# Patient Record
Sex: Female | Born: 1945 | Race: White | Hispanic: No | Marital: Married
Health system: Midwestern US, Community
[De-identification: ages and names within clinical notes are randomized; demographics above are authoritative.]

## PROBLEM LIST (undated history)

## (undated) DIAGNOSIS — I77819 Aortic ectasia, unspecified site: Secondary | ICD-10-CM

## (undated) DIAGNOSIS — K824 Cholesterolosis of gallbladder: Secondary | ICD-10-CM

---

## 2016-02-25 MED ORDER — VALSARTAN 320 MG TAB
320 mg | ORAL_TABLET | ORAL | 3 refills | Status: DC
Start: 2016-02-25 — End: 2017-02-25

## 2016-03-17 ENCOUNTER — Ambulatory Visit: Admit: 2016-03-17 | Discharge: 2016-03-17 | Payer: MEDICARE | Attending: Surgery

## 2016-03-17 DIAGNOSIS — K824 Cholesterolosis of gallbladder: Secondary | ICD-10-CM

## 2016-03-17 NOTE — Patient Instructions (Signed)
She will go for a repeats sonography

## 2016-03-17 NOTE — Progress Notes (Signed)
History and Physical    Patient: Tiffany Harmon MRN: [id number]  SSN: [id number]    Date of Birth: 1946-05-30  Age: 70 y.o.  Sex: female      Subjective:      Tiffany Harmon is a 70 y.o. female who Is a patient of Dr.KS, is a diabetic  And complains of feeling some Pressure and discomfort In the right upper quadrant of her abdomen.  No history of jaundice, no previous surgeries and the sonogram Done in December 2016 shows a polyp in the gallblader versus cholesterolosis.  She has a relative who had gallbladder cancer and her friend has bile duct cancer who is doing well after surgery.  Her abdominal exam is benign today.  I will repeat her sonogram And she will most likely need a laparoscopic cholecystectomy    Past Medical History:   Diagnosis Date   ??? Asthma    ??? Diabetes (HCC)    ??? Hypertension    ??? Thyroid disease      History reviewed. No pertinent surgical history.   Family History   Problem Relation Age of Onset   ??? Cancer Father 4370     Breast  & prostate   ??? Cancer Son 3734     hodgkins / lymphoma     Social History   Substance Use Topics   ??? Smoking status: Never Smoker   ??? Smokeless tobacco: Not on file   ??? Alcohol use Yes      Comment: socially      Prior to Admission medications    Medication Sig Start Date End Date Taking? Authorizing Provider   furosemide (LASIX) 20 mg tablet Take  by mouth daily.   Yes Historical Provider   AMLODIPINE BESYLATE (AMLODIPINE PO) Take  by mouth.   Yes Historical Provider   magnesium oxide (MAG-OX) 400 mg tablet Take 400 mg by mouth daily.   Yes Historical Provider   levothyroxine (SYNTHROID) 112 mcg tablet Take  by mouth Daily (before breakfast).   Yes Historical Provider   metFORMIN (GLUCOPHAGE) 500 mg tablet Take  by mouth two (2) times daily (with meals).   Yes Historical Provider   levocetirizine (XYZAL) 5 mg tablet Take  by mouth.   Yes Historical Provider   montelukast (SINGULAIR) 10 mg tablet Take 10 mg by mouth daily.   Yes Historical Provider    cetirizine (ZYRTEC) 10 mg tablet Take  by mouth.   Yes Historical Provider   multivitamin, tx-iron-ca-min (THERA-M W/ IRON) 9 mg iron-400 mcg tab tablet Take 1 Tab by mouth daily.   Yes Historical Provider   calcium citrate-vitamin D3 (CITRACAL + D) tablet Take  by mouth two (2) times a day.   Yes Historical Provider   vit B Cmplx 3-FA-Vit C-Biotin (NEPHRO VITE RX) 1-60-300 mg-mg-mcg tablet Take 1 Tab by mouth daily.   Yes Historical Provider   cholecalciferol, vitamin D3, (VITAMIN D3) 2,000 unit tab Take  by mouth.   Yes Historical Provider   LACTOBACILLUS ACIDOPHILUS (PROBIOTIC PO) Take  by mouth.   Yes Historical Provider   Omega-3-DHA-EPA-Fish Oil 1,000 mg (120 mg-180 mg) cap Take  by mouth.   Yes Historical Provider   amylase-lipase-protease (CREON 12000) 12,000-38,000 -60,000 unit capsule Take  by mouth three (3) times daily (with meals).   Yes Historical Provider   valsartan (DIOVAN) 320 mg tablet TAKE 1 TABLET BY MOUTH EVERY DAY 02/25/16  Yes Thurnell GarbeShital Parikh, MD        Allergies  Allergen Reactions   ??? Aspirin Unknown (comments)     Headache    ??? Codeine Nausea and Vomiting       Review of Systems:  Negative for arthritis, phlebitis, myocardial, infarction, angina, shortness of breath, stroke, asthma, headaches, TB, emphysema, chronic cough, nausea, vomiting, diarrhea,, constipation or alteration of bowel habits, rectal bleeding pain, blood in stool or black stools, weight loss, anorexia, jaundice, heartburn, nocturia, urinary frequency, kidney stones.    Objective:     Vitals:    03/17/16 1143   BP: 134/87   Pulse: 70        Physical Exam:  Vital signs as above.  Well developed, well nourished individual, alert and oriented.  HEAD &  NECK:  no jaundice, eye movements normal, pharynx clear, neck supple without masses or nodes.  Lungs: Clear to auscultation.  Heart: Both heart sounds heard with regular rate. Breast: No masses, no tenderness, no palpable nodes, no discharge.  Skin, neurovascular,  extremities: normal, no edema, no adenopathy.  Abdomen: Soft, no tenderness, liver, kidneys and spleen not palpable, no hernias.  Rectal exam deferred.    Labs:  No results found for this or any previous visit (from the past 24 hour(s)).    Imaging:    No results found.    Assessment:     Hospital Problems  Date Reviewed: 03/17/2016    None          Plan:   To go for repeat sonog,Procedure discussed with the patient in detail, benefits, possible risks and complications explained to the patient.  The patient understands and agrees.      Signed By: Luetta Nutting, MD     March 17, 2016

## 2016-03-18 MED ORDER — FUROSEMIDE 20 MG TAB
20 mg | ORAL_TABLET | Freq: Every day | ORAL | 1 refills | Status: DC
Start: 2016-03-18 — End: 2016-09-11

## 2016-03-18 NOTE — Telephone Encounter (Signed)
Requested Prescriptions     Pending Prescriptions Disp Refills   ??? furosemide (LASIX) 20 mg tablet 90 Tab 1     Sig: Take 1 Tab by mouth daily.

## 2016-03-20 ENCOUNTER — Encounter: Attending: Interventional Cardiology

## 2016-04-03 MED ORDER — AMLODIPINE 10 MG TAB
10 mg | ORAL_TABLET | Freq: Every day | ORAL | 1 refills | Status: DC
Start: 2016-04-03 — End: 2016-09-25

## 2016-04-03 NOTE — Telephone Encounter (Signed)
Requested Prescriptions     Pending Prescriptions Disp Refills   ??? amLODIPine (NORVASC) 10 mg tablet 90 Tab 1     Sig: Take 1 Tab by mouth daily.

## 2016-05-12 ENCOUNTER — Encounter

## 2016-05-13 ENCOUNTER — Encounter

## 2016-05-20 ENCOUNTER — Inpatient Hospital Stay: Admit: 2016-05-20 | Payer: MEDICARE | Attending: Surgery

## 2016-05-20 DIAGNOSIS — K824 Cholesterolosis of gallbladder: Secondary | ICD-10-CM

## 2016-05-20 LAB — CREATININE AND GFR
Creatinine: 0.66 mg/dL (ref 0.40–1.16)
GFR est AA: >60 mL/min/{1.73_m2} (ref >60)
GFR est non-AA: >60 mL/min/{1.73_m2} (ref >60)

## 2016-05-20 LAB — BUN: BUN: 17 mg/dL (ref 7–18)

## 2016-05-20 MED ORDER — SODIUM CHLORIDE 0.9 % INJECTION
INTRAMUSCULAR | Status: AC
Start: 2016-05-20 — End: 2016-05-20
  Administered 2016-05-20: 14:00:00

## 2016-05-20 MED ORDER — GADOBUTROL 10 MMOL/10 ML (1 MMOL/ML) IV
10 mmol/ mL (1 mmol/mL) | INTRAVENOUS | Status: AC
Start: 2016-05-20 — End: 2016-05-20
  Administered 2016-05-20: 14:00:00

## 2016-05-20 MED FILL — SODIUM CHLORIDE 0.9 % INJECTION: INTRAMUSCULAR | Qty: 50

## 2016-05-20 MED FILL — GADAVIST 10 MMOL/10 ML (1 MMOL/ML) INTRAVENOUS SOLUTION: 10 mmol/ mL (1 mmol/mL) | INTRAVENOUS | Qty: 10

## 2016-06-03 ENCOUNTER — Ambulatory Visit: Attending: Interventional Cardiology

## 2016-06-03 ENCOUNTER — Ambulatory Visit: Admit: 2016-06-03 | Discharge: 2016-06-03 | Payer: MEDICARE | Attending: Interventional Cardiology

## 2016-06-03 DIAGNOSIS — I10 Essential (primary) hypertension: Secondary | ICD-10-CM

## 2016-06-03 NOTE — Progress Notes (Signed)
Perminder Lynnell Dike, MD [facility]       Mitzi Hansen, MD [facility]     Doris Cheadle, MD [facility]      Norberta Keens Wyline Mood, MD [facility]     Hilary Hertz, MD [facility]       Santiago Bumpers, MD [facility] Office: [phone]       [place] [phone]  ______________________________________________________________________    Impressions/Visit Diagnoses:  Patient Active Problem List    Diagnosis Date Noted   ??? Essential hypertension 06/03/2016   ??? Type 2 diabetes mellitus (HCC) 06/03/2016   ??? Aortic dilatation (HCC) - very mild (3.4cm) on CTA (05/2015) 06/03/2016   ??? Morbid obesity due to excess calories (HCC) 06/03/2016               Recommendations/Plan:  1. EKG was reviewed with the patient and shows no change from prior EKGs  2. Pressure is adequately controlled??? continue current medications  3. Diet and exercise were counseled with the patient  4. No change in medications  5. Follow-up in 1 year      I thank you for giving me opportunity to participate in care of Tiffany Harmon. I will update you with further plan of Her care at the time of follow up, mean while, should there be any questions or comments about Her management, please do not hesitate to contact me.    Sincerely,  Thurnell Garbe, MD      ______________________________________________________________________      No change in exercise capacity, No CP/SOB/Dizziness or Palpitations. No C/O LE edema.      Past Medical History:   Diagnosis Date   ??? Asthma    ??? Diabetes (HCC)    ??? Hypertension    ??? Thyroid disease        History reviewed. No pertinent surgical history.        Allergies   Allergen Reactions   ??? Aspirin Unknown (comments)     Headache    ??? Codeine Nausea and Vomiting       Family History   Problem Relation Age of Onset   ??? Cancer Father 44     Breast  & prostate   ??? Cancer Son 78     hodgkins / lymphoma       Social History     Social History   ??? Marital status: MARRIED     Spouse name: N/A   ??? Number of children: N/A    ??? Years of education: N/A     Occupational History   ??? Not on file.     Social History Main Topics   ??? Smoking status: Never Smoker   ??? Smokeless tobacco: Never Used   ??? Alcohol use Yes      Comment: socially   ??? Drug use: No   ??? Sexual activity: Not on file     Other Topics Concern   ??? Not on file     Social History Narrative         Review of Systems   Constitutional: Negative for chills, diaphoresis, fever, malaise/fatigue and weight loss.   Respiratory: Negative for cough and shortness of breath.    Cardiovascular: Negative for chest pain, palpitations, orthopnea and leg swelling.   Gastrointestinal: Negative for abdominal pain, diarrhea, heartburn, nausea and vomiting.   Neurological: Negative for dizziness, loss of consciousness and weakness.  Physical Exam:      Visit Vitals   ??? BP 114/84 (BP 1 Location: Right arm, BP Patient Position: Sitting)   ??? Pulse 70   ??? Resp 18   ??? Ht 5\' 3"  (1.6 m)   ??? Wt 208 lb (94.3 kg)   ??? BMI 36.85 kg/m2       Physical Exam:    General: Alert, comfortable   HEENT: Normocephalic, atraumatic   Neck: No JVD, No Bruits   Chest wall: NL excursion   Lungs: Clear B/L   CV: NL S1, S2, no murmurs, gallops   Abdomen: Soft, NL BS, No distention   Extremity:No clubbing, cyanosis, edema   Neuro: Alert, oriented   Skin: No open sores, bruises   Pulses:  2+ dorsalis pedis    Tests - Procedures Reviewed      ECG (06/03/16)   Normal sinus rhythm, within normal limits  none    Labs - reviewed     Hospital Outpatient Visit on 05/20/2016   Component Date Value Ref Range Status   ??? Creatinine 05/20/2016 0.66  0.40 - 1.16 mg/dL Final   ??? GFR est AA 05/20/2016 >60  >60 ml/min/1.46m2 Final   ??? GFR est non-AA 05/20/2016 >60  >60 ml/min/1.25m2 Final    Comment: (NOTE)  Estimated GFR is calculated using the Modification of Diet in Renal   Disease (MDRD) Study equation, reported for both African Americans   (GFRAA) and non-African Americans (GFRNA), and normalized to 1.38m2    body surface area. The physician must decide which value applies to   the patient. The MDRD study equation should only be used in   individuals age 40 or older. It has not been validated for the   following: pregnant women, patients with serious comorbid conditions,   or on certain medications, or persons with extremes of body size,   muscle mass, or nutritional status.     ??? BUN 05/20/2016 17  7 - 18 mg/dL Final       No results found for: CHOL, HDL, 100065, [id number], 1000, LDLC, VLDL    Care Plan Discussion   Patient   Family   Other Physician :    Counseling Performed   Dietary Counseling and Exercise    Records Requested / Reviewed   Hospital records / Records from other physicians reviewed - summary as above. Copies in chart

## 2016-09-11 MED ORDER — FUROSEMIDE 20 MG TAB
20 mg | ORAL_TABLET | ORAL | 1 refills | Status: DC
Start: 2016-09-11 — End: 2017-02-23

## 2016-09-11 NOTE — Telephone Encounter (Signed)
Requested Prescriptions     Pending Prescriptions Disp Refills   ??? furosemide (LASIX) 20 mg tablet [Pharmacy Med Name: FUROSEMIDE 20 MG TABLET] 90 Tab 1     Sig: TAKE 1 TABLET BY MOUTH EVERY DAY

## 2016-09-25 MED ORDER — AMLODIPINE 10 MG TAB
10 mg | ORAL_TABLET | ORAL | 1 refills | Status: DC
Start: 2016-09-25 — End: 2017-04-14

## 2017-02-23 MED ORDER — FUROSEMIDE 20 MG TAB
20 mg | ORAL_TABLET | ORAL | 1 refills | Status: DC
Start: 2017-02-23 — End: 2017-08-06

## 2017-02-24 NOTE — Telephone Encounter (Signed)
Requested Prescriptions     Pending Prescriptions Disp Refills   ??? valsartan (DIOVAN) 320 mg tablet 90 Tab 1     Sig: TAKE 1 TABLET BY MOUTH EVERY DAY     par

## 2017-02-25 MED ORDER — VALSARTAN 320 MG TAB
320 mg | ORAL_TABLET | ORAL | 1 refills | Status: DC
Start: 2017-02-25 — End: 2017-08-25

## 2017-04-14 MED ORDER — AMLODIPINE 10 MG TAB
10 mg | ORAL_TABLET | Freq: Every day | ORAL | 1 refills | Status: DC
Start: 2017-04-14 — End: 2017-06-11

## 2017-04-14 NOTE — Telephone Encounter (Signed)
Requested Prescriptions     Pending Prescriptions Disp Refills   ??? amLODIPine (NORVASC) 10 mg tablet 90 Tab 1     Sig: Take 1 Tab by mouth daily.

## 2017-05-18 NOTE — Telephone Encounter (Signed)
-----   Message from Joesph July sent at 05/17/2017  4:13 PM EDT -----  Regarding: RE: Ankle swelling  Pt is in [place] until Labor day, she has an appt set up for when she returns.    ----- Message -----     From: Fara Boros, MD     Sent: 05/17/2017   4:01 PM       To: Conard Novak, LPN, #  Subject: RE: Ankle swelling                               Patient has not been seen in our office since last September.  She needs to be seen by whoever can see her ASAP  ----- Message -----     From: Conard Novak, LPN     Sent: 03/04/3709  11:56 AM       To: Fara Boros, MD  Subject: FW: Ankle swelling                               Spoke with pt this morning.  States she has had swelling in her ankle all summer.  Broke her Tibia at the beginning of the summer and has not been as active as usual.  She does take Amlodipine 10mg  but has been on this medication for a long time.  She has recently started Evista and is not sure if that could cause it.  States the swelling is mostly in the ankle on the same side of the fracture but is happening on the other ankle as well.  She also on valsartan and is inquiring about switching.  She is a patient of Dr. Ramiro Harvest.  Please advise.     ----- Message -----     From: Joesph July     Sent: 05/17/2017  11:41 AM       To: [facility]  Subject: Ankle swelling                                   [facility] - Telephone Message  RE: Tiffany Harmon 71 y.o. August 04, 1946     [phone]         Telephone Information:  Mobile          [phone]      patient called.    Reason for Call: Pt states she has had ankle swelling. She also mentioned that she broke her Tibia and hasn't been very active. Call the mobile #.

## 2017-06-08 ENCOUNTER — Encounter: Attending: Interventional Cardiology

## 2017-06-11 ENCOUNTER — Ambulatory Visit: Admit: 2017-06-11 | Discharge: 2017-06-11 | Payer: MEDICARE | Attending: Interventional Cardiology

## 2017-06-11 DIAGNOSIS — I77819 Aortic ectasia, unspecified site: Secondary | ICD-10-CM

## 2017-06-11 MED ORDER — AMLODIPINE 5 MG TAB
5 mg | ORAL_TABLET | Freq: Every day | ORAL | 3 refills | Status: DC
Start: 2017-06-11 — End: 2017-07-02

## 2017-06-11 NOTE — Progress Notes (Signed)
Tiffany Lynnell DikeS Grewal, MD [facility]       Tiffany HansenArvind K Agarwal, MD [facility]     Tiffany CheadleBangalore S Sridhara, MD [facility]      Tiffany KeensSunandan Wyline MoodA Pandya, MD [facility]     Tiffany HertzShital R Caelynn Marshman, MD [facility]       Tiffany Bumpersajiv Singh, MD [facility]       [place] Office: [phone]       [place] Office:([phone]  ______________________________________________________________________    Impressions/Visit Diagnoses:  Patient Active Problem List    Diagnosis Date Noted   ??? Swelling of lower extremity 06/11/2017   ??? Essential hypertension 06/03/2016   ??? Type 2 diabetes mellitus (HCC) 06/03/2016   ??? Aortic dilatation (HCC) - very mild (3.4cm) on CTA (05/2015) 06/03/2016   ??? Morbid obesity due to excess calories (HCC) 06/03/2016               Recommendations/Plan:  1. EKG was reviewed with the patient and shows no change from prior EKGs  2. Blood pressure is adequately controlled??? continue current medications  3. Diet and exercise were counseled with the patient  4. Decrease amlodipine to 5 mg daily  5. Echo to assess EF  6. Follow-up in 1 month      I thank you for giving me opportunity to participate in care of Tiffany Harmon. I will update you with further plan of Her care at the time of follow up, mean while, should there be any questions or comments about Her management, please do not hesitate to contact me.    Sincerely,  Tiffany GarbeShital Joyceline Maiorino, MD      ______________________________________________________________________      No change in exercise capacity, No CP/SOB/Dizziness or Palpitations. LE edema daily for the last 2 months.    Past Medical History:   Diagnosis Date   ??? Asthma    ??? Diabetes (HCC)    ??? Hypertension    ??? Thyroid disease    ??? Tibia fracture 02/2017       No past surgical history on file.        Allergies   Allergen Reactions   ??? Aspirin Unknown (comments)     Headache    ??? Codeine Nausea and Vomiting       Family History   Problem Relation Age of Onset   ??? Cancer Father 5470     Breast  & prostate   ??? Cancer Son 4734     hodgkins / lymphoma        Social History     Social History   ??? Marital status: MARRIED     Spouse name: N/A   ??? Number of children: N/A   ??? Years of education: N/A     Occupational History   ??? Not on file.     Social History Main Topics   ??? Smoking status: Never Smoker   ??? Smokeless tobacco: Never Used   ??? Alcohol use Yes      Comment: socially   ??? Drug use: No   ??? Sexual activity: Not on file     Other Topics Concern   ??? Not on file     Social History Narrative         Review of Systems   Constitutional: Negative for chills, diaphoresis, fever, malaise/fatigue and weight loss.   Respiratory: Negative for cough and shortness of breath.    Cardiovascular: Negative for chest pain, palpitations, orthopnea and leg swelling.   Gastrointestinal: Negative for abdominal pain,  diarrhea, heartburn, nausea and vomiting.   Neurological: Negative for dizziness, loss of consciousness and weakness.           Physical Exam:      Visit Vitals   ??? BP 130/80 (BP 1 Location: Right arm, BP Patient Position: Sitting)   ??? Pulse 67   ??? Resp 16   ??? Ht  (1.6 m)   ??? Wt 220 lb (99.8 kg)   ??? BMI 38.97 kg/m2       Physical Exam:    General: Alert, comfortable   HEENT: Normocephalic, atraumatic   Neck: No JVD, No Bruits   Chest wall: NL excursion   Lungs: Clear B/L   CV: NL S1, S2, no murmurs, gallops   Abdomen: Soft, NL BS, No distention   Extremity:No clubbing, cyanosis, edema   Neuro: Alert, oriented   Skin: No open sores, bruises   Pulses:  2+ dorsalis pedis    Tests - Procedures Reviewed      ECG (06/11/17)   Normal sinus rhythm, normal axis, incomplete right bundle branch block  none    Labs - reviewed         No results found for: CHOL, HDL, 100065, [id number], 1000, LDLC, VLDL    Care Plan Discussion   Patient   Family   Other Physician :    Counseling Performed   Dietary Counseling and Exercise    Records Requested / Reviewed   Hospital records / Records from other physicians reviewed - summary as above. Copies in chart

## 2017-06-22 ENCOUNTER — Encounter: Admit: 2017-06-22 | Discharge: 2017-06-22 | Payer: MEDICARE

## 2017-06-28 ENCOUNTER — Encounter

## 2017-07-02 ENCOUNTER — Ambulatory Visit: Admit: 2017-07-02 | Discharge: 2017-07-02 | Payer: MEDICARE | Attending: Interventional Cardiology

## 2017-07-02 DIAGNOSIS — I77819 Aortic ectasia, unspecified site: Secondary | ICD-10-CM

## 2017-07-02 MED ORDER — HYDRALAZINE 25 MG TAB
25 mg | ORAL_TABLET | Freq: Two times a day (BID) | ORAL | 2 refills | Status: DC
Start: 2017-07-02 — End: 2017-08-06

## 2017-07-02 NOTE — Progress Notes (Signed)
Perminder Lynnell Dike, MD [facility]       Mitzi Hansen, MD [facility]     Doris Cheadle, MD [facility]      Norberta Keens Wyline Mood, MD [facility]     Hilary Hertz, MD [facility]       Santiago Bumpers, MD [facility] Office: [phone]       [place] [phone]  ______________________________________________________________________    Impressions/Visit Diagnoses:  Patient Active Problem List    Diagnosis Date Noted   ??? Swelling of lower extremity 06/11/2017   ??? Essential hypertension 06/03/2016   ??? Type 2 diabetes mellitus (HCC) 06/03/2016   ??? Aortic dilatation (HCC) - very mild (3.4cm) on CTA (05/2015) 06/03/2016   ??? Morbid obesity due to excess calories (HCC) 06/03/2016             Recommendations/Plan:  1. Echocardiogram results were reviewed with the patient and show a prominent ascending aorta at 3.6 cm which is unchanged from prior measurements.  LV function and valve status is normal  2. Blood pressure is elevated and pt still c/o LE edema though improved  3. D/C amlodipine and start hydralazine at 25 mg q12  4. Diet and exercise were counseled with the patient  5. F/u in one month      I thank you for giving me opportunity to participate in care of Tiffany Harmon. I will update you with further plan of Her care at the time of follow up, mean while, should there be any questions or comments about Her management, please do not hesitate to contact me.    Sincerely,  Thurnell Garbe, MD      ______________________________________________________________________      No change in exercise capacity, No CP/SOB/Dizziness or Palpitations. LE edema has improved on lower dose of amlodipine - though still present.  BP elevated to 140's    Past Medical History:   Diagnosis Date   ??? Asthma    ??? Diabetes (HCC)    ??? Hypertension    ??? Thyroid disease    ??? Tibia fracture 02/2017       No past surgical history on file.        Allergies   Allergen Reactions   ??? Aspirin Unknown (comments)     Headache     ??? Codeine Nausea and Vomiting       Family History   Problem Relation Age of Onset   ??? Cancer Father 93     Breast  & prostate   ??? Cancer Son 89     hodgkins / lymphoma       Social History     Social History   ??? Marital status: MARRIED     Spouse name: N/A   ??? Number of children: N/A   ??? Years of education: N/A     Occupational History   ??? Not on file.     Social History Main Topics   ??? Smoking status: Never Smoker   ??? Smokeless tobacco: Never Used   ??? Alcohol use Yes      Comment: socially   ??? Drug use: No   ??? Sexual activity: Not on file     Other Topics Concern   ??? Not on file     Social History Narrative         Review of Systems   Constitutional: Negative for chills, diaphoresis, fever, malaise/fatigue and weight loss.   Respiratory: Negative  for cough and shortness of breath.    Cardiovascular: Negative for chest pain, palpitations, orthopnea and leg swelling.   Gastrointestinal: Negative for abdominal pain, diarrhea, heartburn, nausea and vomiting.   Neurological: Negative for dizziness, loss of consciousness and weakness.           Physical Exam:      Visit Vitals   ??? BP 154/78 (BP 1 Location: Right arm, BP Patient Position: Sitting)   ??? Pulse 72   ??? Resp 16   ??? Ht 5\' 3"  (1.6 m)   ??? Wt 224 lb (101.6 kg)   ??? BMI 39.68 kg/m2       Physical Exam:    General: Alert, comfortable   HEENT: Normocephalic, atraumatic   Neck: No JVD, No Bruits   Chest wall: NL excursion   Lungs: Clear B/L   CV: NL S1, S2, no murmurs, gallops   Abdomen: Soft, NL BS, No distention   Extremity:No clubbing, cyanosis, trace edema   Neuro: Alert, oriented   Skin: No open sores, bruises   Pulses:  2+ dorsalis pedis    Tests - Procedures Reviewed      Echocardiogram dated 06/22/2017    Labs - reviewed         No results found for: CHOL, HDL, 100065, [id number], 1000, LDLC, VLDL    Care Plan Discussion   [x] Patient   [] Family   [] Other Physician :    Counseling Performed   Dietary Counseling and Exercise    Records Requested / Reviewed    Hospital records / Records from other physicians reviewed - summary as above. Copies in chart

## 2017-07-07 NOTE — Telephone Encounter (Signed)
Pt called stating bp still high 150/80-160/90    Per Dr. Donnelly Stager, pt to increase Hydralazine to tid monitor BP , give it about a week. If BP doesn't improve to call the office

## 2017-08-06 ENCOUNTER — Ambulatory Visit: Admit: 2017-08-06 | Discharge: 2017-08-06 | Payer: MEDICARE | Attending: Interventional Cardiology

## 2017-08-06 DIAGNOSIS — I10 Essential (primary) hypertension: Secondary | ICD-10-CM

## 2017-08-06 MED ORDER — HYDROCHLOROTHIAZIDE 25 MG TAB
25 mg | ORAL_TABLET | Freq: Every day | ORAL | 6 refills | Status: DC
Start: 2017-08-06 — End: 2017-09-10

## 2017-08-06 MED ORDER — POTASSIUM CHLORIDE SR 10 MEQ TAB, PARTICLES/CRYSTALS
10 mEq | ORAL_TABLET | Freq: Every day | ORAL | 6 refills | Status: DC
Start: 2017-08-06 — End: 2017-10-05

## 2017-08-06 NOTE — Progress Notes (Signed)
Tiffany Lynnell DikeS Grewal, MD [facility]       Tiffany HansenArvind K Agarwal, MD [facility]     Tiffany CheadleBangalore S Sridhara, MD [facility]      Tiffany KeensSunandan Wyline MoodA Pandya, MD [facility]     Tiffany HertzShital R Jakwan Sally, MD [facility]       Tiffany Bumpersajiv Singh, MD [facility]       [place] Office: [phone]       [place] [phone]  ______________________________________________________________________    Impressions/Visit Diagnoses:  Patient Active Problem List    Diagnosis Date Noted   ??? Swelling of lower extremity 06/11/2017   ??? Essential hypertension 06/03/2016   ??? Type 2 diabetes mellitus (HCC) 06/03/2016   ??? Aortic dilatation (HCC) - very mild (3.4cm) on CTA (05/2015) 06/03/2016   ??? Morbid obesity due to excess calories (HCC) 06/03/2016             Recommendations/Plan:  1. Blood pressure is still elevated with 5 mg of Amlodipine  2. D/C Hydralazine (no apparent benefit in terms of BP)  3. Change lasix to HCTZ 25 mg daily  4. Diet and exercise were counseled with the patient  5. F/u in one month      I thank you for giving me opportunity to participate in care of Tiffany KassCheryl Harmon. I will update you with further plan of Her care at the time of follow up, mean while, should there be any questions or comments about Her management, please do not hesitate to contact me.    Sincerely,  Tiffany GarbeShital Caelyn Route, MD      ______________________________________________________________________      No change in exercise capacity, No CP/SOB/Dizziness or Palpitations.BP still elevated to 140's    Past Medical History:   Diagnosis Date   ??? Asthma    ??? Diabetes (HCC)    ??? Hypertension    ??? Thyroid disease    ??? Tibia fracture 02/2017       No past surgical history on file.        Allergies   Allergen Reactions   ??? Aspirin Unknown (comments)     Headache    ??? Codeine Nausea and Vomiting       Family History   Problem Relation Age of Onset   ??? Cancer Father 5670        Breast  & prostate   ??? Cancer Son 1734        hodgkins / lymphoma       Social History     Socioeconomic History   ??? Marital status: MARRIED      Spouse name: Not on file   ??? Number of children: Not on file   ??? Years of education: Not on file   ??? Highest education level: Not on file   Social Needs   ??? Financial resource strain: Not on file   ??? Food insecurity - worry: Not on file   ??? Food insecurity - inability: Not on file   ??? Transportation needs - medical: Not on file   ??? Transportation needs - non-medical: Not on file   Occupational History   ??? Not on file   Tobacco Use   ??? Smoking status: Never Smoker   ??? Smokeless tobacco: Never Used   Substance and Sexual Activity   ??? Alcohol use: Yes     Comment: socially   ??? Drug use: No   ??? Sexual activity: Not on file   Other Topics Concern   ??? Not on file  Social History Narrative   ??? Not on file         Review of Systems   Constitutional: Negative for chills, diaphoresis, fever, malaise/fatigue and weight loss.   Respiratory: Negative for cough and shortness of breath.    Cardiovascular: Negative for chest pain, palpitations, orthopnea and leg swelling.   Gastrointestinal: Negative for abdominal pain, diarrhea, heartburn, nausea and vomiting.   Neurological: Negative for dizziness, loss of consciousness and weakness.           Physical Exam:      Visit Vitals  BP 146/82 (BP 1 Location: Right arm, BP Patient Position: Sitting)   Pulse 62   Resp 18   Ht 5\' 3"  (1.6 m)   Wt 215 lb (97.5 kg)   BMI 38.09 kg/m??       Physical Exam:    General: Alert, comfortable   HEENT: Normocephalic, atraumatic   Neck: No JVD, No Bruits   Chest wall: NL excursion   Lungs: Clear B/L   CV: NL S1, S2, no murmurs, gallops   Abdomen: Soft, NL BS, No distention   Extremity:No clubbing, cyanosis, trace edema   Neuro: Alert, oriented   Skin: No open sores, bruises   Pulses:  2+ dorsalis pedis    Tests - Procedures Reviewed      Echocardiogram dated 06/22/2017    Labs - reviewed         No results found for: CHOL, HDL, 100065, [id number], 1000, LDLC, VLDL    Care Plan Discussion   [x] Patient   [] Family   [] Other Physician :     Counseling Performed   Dietary Counseling and Exercise    Records Requested / Reviewed   Hospital records / Records from other physicians reviewed - summary as above. Copies in chart

## 2017-08-25 MED ORDER — VALSARTAN 320 MG TAB
320 mg | ORAL_TABLET | ORAL | 1 refills | Status: DC
Start: 2017-08-25 — End: 2018-03-09

## 2017-08-25 NOTE — Telephone Encounter (Signed)
Requested Prescriptions     Pending Prescriptions Disp Refills   ??? valsartan (DIOVAN) 320 mg tablet 90 Tab 1     Sig: TAKE 1 TABLET BY MOUTH EVERY DAY

## 2017-08-26 NOTE — Telephone Encounter (Addendum)
[facility] for [facility].     ----- Message from Eli PhillipsKassidy A Diaz sent at 08/25/2017  9:07 AM EST -----  Patient called and states that her BP is high. She is going to a funeral and wont be home till 12pm her number is [phone]. She has an appointment with Dr.Parikh on 12/14

## 2017-09-02 MED ORDER — AMLODIPINE 5 MG TAB
5 mg | ORAL_TABLET | Freq: Every day | ORAL | 3 refills | Status: DC
Start: 2017-09-02 — End: 2018-03-01

## 2017-09-03 ENCOUNTER — Encounter: Attending: Interventional Cardiology

## 2017-09-10 ENCOUNTER — Ambulatory Visit: Admit: 2017-09-10 | Discharge: 2017-09-10 | Payer: MEDICARE | Attending: Interventional Cardiology

## 2017-09-10 DIAGNOSIS — I10 Essential (primary) hypertension: Secondary | ICD-10-CM

## 2017-09-10 MED ORDER — CHLORTHALIDONE 25 MG TAB
25 mg | ORAL_TABLET | Freq: Every day | ORAL | 3 refills | Status: DC
Start: 2017-09-10 — End: 2018-03-09

## 2017-09-10 NOTE — Progress Notes (Signed)
Perminder Lynnell DikeS Grewal, MD [facility]       Mitzi HansenArvind K Agarwal, MD [facility]     Doris CheadleBangalore S Sridhara, MD [facility]      Norberta KeensSunandan Wyline MoodA Pandya, MD [facility]     Hilary HertzShital R Joselin Crandell, MD [facility]FACC       Santiago Bumpersajiv Singh, MD [facility]       [place] Office: [phone]       [place] [phone]  ______________________________________________________________________    Impressions/Visit Diagnoses:  Patient Active Problem List    Diagnosis Date Noted   ??? Swelling of lower extremity 06/11/2017   ??? Essential hypertension 06/03/2016   ??? Type 2 diabetes mellitus (HCC) 06/03/2016   ??? Aortic dilatation (HCC) - very mild (3.4cm) on CTA (05/2015) 06/03/2016   ??? Morbid obesity due to excess calories (HCC) 06/03/2016             Recommendations/Plan:  1. EKG was reviewed with the patient and shows normal sinus rhythm and is within normal limits  2. Blood pressure remains elevated on amlodipine, Diovan 320, hydrochlorothiazide 25  3. D/C HCTZ and start chlorthalidone at 25 mg daily  4. Diet and exercise were counseled with the patient  5. F/u in 3 months      I thank you for giving me opportunity to participate in care of Tiffany KassCheryl Harmon. I will update you with further plan of Her care at the time of follow up, mean while, should there be any questions or comments about Her management, please do not hesitate to contact me.    Sincerely,  Thurnell GarbeShital Kerrie Timm, MD      ______________________________________________________________________      No change in exercise capacity, No CP/SOB/Dizziness or Palpitations.    Past Medical History:   Diagnosis Date   ??? Asthma    ??? Diabetes (HCC)    ??? Hypertension    ??? Thyroid disease    ??? Tibia fracture 02/2017       No past surgical history on file.        Allergies   Allergen Reactions   ??? Aspirin Unknown (comments)     Headache    ??? Codeine Nausea and Vomiting       Family History   Problem Relation Age of Onset   ??? Cancer Father 2570        Breast  & prostate   ??? Cancer Son 9634        hodgkins / lymphoma       Social History      Socioeconomic History   ??? Marital status: MARRIED     Spouse name: Not on file   ??? Number of children: Not on file   ??? Years of education: Not on file   ??? Highest education level: Not on file   Social Needs   ??? Financial resource strain: Not on file   ??? Food insecurity - worry: Not on file   ??? Food insecurity - inability: Not on file   ??? Transportation needs - medical: Not on file   ??? Transportation needs - non-medical: Not on file   Occupational History   ??? Not on file   Tobacco Use   ??? Smoking status: Never Smoker   ??? Smokeless tobacco: Never Used   Substance and Sexual Activity   ??? Alcohol use: Yes     Comment: socially   ??? Drug use: No   ??? Sexual activity: Not on file   Other Topics Concern   ???  Not on file   Social History Narrative   ??? Not on file         Review of Systems   Constitutional: Negative for chills, diaphoresis, fever, malaise/fatigue and weight loss.   Respiratory: Negative for cough and shortness of breath.    Cardiovascular: Negative for chest pain, palpitations, orthopnea and leg swelling.   Gastrointestinal: Negative for abdominal pain, diarrhea, heartburn, nausea and vomiting.   Neurological: Negative for dizziness, loss of consciousness and weakness.           Physical Exam:      Visit Vitals  BP 140/74 (BP 1 Location: Right arm, BP Patient Position: Sitting) Comment: ausculated gap 156-130   Pulse 60   Resp 16   Ht 5\' 3"  (1.6 m)   Wt 212 lb (96.2 kg)   BMI 37.55 kg/m??       Physical Exam:    General: Alert, comfortable   HEENT: Normocephalic, atraumatic   Neck: No JVD, No Bruits   Chest wall: NL excursion   Lungs: Clear B/L   CV: NL S1, S2, no murmurs, gallops   Abdomen: Soft, NL BS, No distention   Extremity:No clubbing, cyanosis, trace edema   Neuro: Alert, oriented   Skin: No open sores, bruises   Pulses:  2+ dorsalis pedis    Tests - Procedures Reviewed    EKG:  NSR, WNL  Echocardiogram dated 06/22/2017    Labs - reviewed          No results found for: CHOL, HDL, 100065, [id number], 1000, LDLC, VLDL    Care Plan Discussion   [x] Patient   [] Family   [] Other Physician :    Counseling Performed   Dietary Counseling and Exercise    Records Requested / Reviewed   Hospital records / Records from other physicians reviewed - summary as above. Copies in chart

## 2017-10-05 MED ORDER — POTASSIUM CHLORIDE SR 10 MEQ TAB, PARTICLES/CRYSTALS
10 mEq | ORAL_TABLET | Freq: Every day | ORAL | 1 refills | Status: DC
Start: 2017-10-05 — End: 2018-03-09

## 2017-10-05 NOTE — Telephone Encounter (Signed)
Requested Prescriptions     Pending Prescriptions Disp Refills   ??? potassium chloride (KLOR-CON) 10 mEq tablet 90 Tab 1     Sig: Take 1 Tab by mouth daily.

## 2018-02-08 ENCOUNTER — Encounter: Attending: Interventional Cardiology

## 2018-03-01 MED ORDER — AMLODIPINE 5 MG TAB
5 mg | ORAL_TABLET | Freq: Every day | ORAL | 0 refills | Status: DC
Start: 2018-03-01 — End: 2018-09-06

## 2018-03-01 NOTE — Telephone Encounter (Signed)
Requested Prescriptions     Pending Prescriptions Disp Refills   ??? amLODIPine (NORVASC) 5 mg tablet 90 Tab 0     Sig: Take 1 Tab by mouth daily.

## 2018-03-09 ENCOUNTER — Ambulatory Visit: Admit: 2018-03-09 | Discharge: 2018-03-09 | Payer: MEDICARE | Attending: Interventional Cardiology

## 2018-03-09 MED ORDER — CHLORTHALIDONE 25 MG TAB
25 mg | ORAL_TABLET | Freq: Every day | ORAL | 3 refills | Status: DC
Start: 2018-03-09 — End: 2019-03-17

## 2018-03-09 MED ORDER — VALSARTAN 320 MG TAB
320 mg | ORAL_TABLET | ORAL | 3 refills | Status: DC
Start: 2018-03-09 — End: 2018-11-18

## 2018-03-09 NOTE — Progress Notes (Signed)
Progress  Notes by Thurnell Garbe, MD at 03/09/18 1230                Author: Thurnell Garbe, MD  Service: --  Author Type: Physician       Filed: 03/09/18 1308  Encounter Date: 03/09/2018  Status: Signed          Editor: Thurnell Garbe, MD (Physician)                       Perminder Lynnell Dike, MD [facility]       Mitzi Hansen, MD [facility]      Doris Cheadle, MD [facility]      Rosemary Holms, MD [facility]      Hilary Hertz, MD [facility]       Santiago Bumpers, MD [facility] Office: [phone]       [place] [phone]   ______________________________________________________________________      Impressions/Visit Diagnoses:     Patient Active Problem List           Diagnosis  Date Noted         ?  Swelling of lower extremity  06/11/2017     ?  Essential hypertension  06/03/2016     ?  Type 2 diabetes mellitus (HCC)  06/03/2016     ?  Aortic dilatation (HCC) - very mild (3.4cm) on CTA (05/2015)  06/03/2016         ?  Morbid obesity due to excess calories (HCC)  06/03/2016                       Recommendations/Plan:   1.  EKG was reviewed with the patient and shows normal sinus rhythm and is within normal limits   2.  Blood pressure is well controlled on current regimen of medications   3.  Diet and exercise were counseled with the patient   4.  F/u in 3 months         I thank you for giving me opportunity to participate in care of Tiffany Harmon. I will update you with further plan of Her care at the time of follow up, mean while, should there be any questions or comments about Her management, please do not hesitate  to contact me.      Sincerely,   Thurnell Garbe, MD         ______________________________________________________________________         No change in exercise capacity, No CP/SOB/Dizziness or Palpitations.        Past Medical History:        Diagnosis  Date         ?  Asthma       ?  Diabetes (HCC)       ?  Hypertension       ?  Thyroid disease           ?  Tibia fracture  02/2017            No past surgical history on file.              Allergies        Allergen  Reactions         ?  Aspirin  Unknown (comments)             Headache          ?  Codeine  Nausea and Vomiting             Family History         Problem  Relation  Age of Onset          ?  Cancer  Father  3570              Breast  & prostate          ?  Cancer  Son  4334              hodgkins / lymphoma             Social History          Socioeconomic History         ?  Marital status:  MARRIED              Spouse name:  Not on file         ?  Number of children:  Not on file     ?  Years of education:  Not on file     ?  Highest education level:  Not on file       Occupational History        ?  Not on file       Social Needs         ?  Financial resource strain:  Not on file        ?  Food insecurity:              Worry:  Not on file         Inability:  Not on file        ?  Transportation needs:              Medical:  Not on file         Non-medical:  Not on file       Tobacco Use         ?  Smoking status:  Never Smoker     ?  Smokeless tobacco:  Never Used       Substance and Sexual Activity         ?  Alcohol use:  Yes             Comment: socially         ?  Drug use:  No     ?  Sexual activity:  Not on file       Lifestyle        ?  Physical activity:              Days per week:  Not on file         Minutes per session:  Not on file         ?  Stress:  Not on file       Relationships        ?  Social connections:              Talks on phone:  Not on file         Gets together:  Not on file         Attends religious service:  Not on file         Active member of club or organization:  Not on file         Attends meetings of clubs or organizations:  Not on file  Relationship status:  Not on file        ?  Intimate partner violence:              Fear of current or ex partner:  Not on file         Emotionally abused:  Not on file         Physically abused:  Not on file         Forced sexual activity:  Not on file        Other  Topics  Concern        ?  Not on file       Social History Narrative        ?  Not on file              Review of Systems    Constitutional: Negative for chills, diaphoresis, fever, malaise/fatigue and weight loss.    Respiratory: Negative for cough and shortness of breath.     Cardiovascular: Negative for chest pain, palpitations, orthopnea and leg swelling.    Gastrointestinal: Negative for abdominal pain, diarrhea, heartburn, nausea and vomiting.    Neurological: Negative for dizziness, loss of consciousness and weakness.                  Physical Exam:         Visit Vitals      BP  120/80 (BP 1 Location: Left arm, BP Patient Position: Sitting)     Pulse  65     Resp  18     Ht  5\' 3"  (1.6 m)     Wt  224 lb (101.6 kg)        BMI  39.68 kg/m??           Physical Exam:     General: Alert, comfortable    HEENT: Normocephalic, atraumatic    Neck: No JVD, No Bruits    Chest wall: NL excursion    Lungs: Clear B/L    CV: NL S1, S2, no murmurs, gallops    Abdomen: Soft, NL BS, No distention    Extremity:No clubbing, cyanosis, trace edema    Neuro: Alert, oriented    Skin: No open sores, bruises    Pulses:  2+ dorsalis pedis        Tests - Procedures Reviewed      EKG:  NSR, WNL   Echocardiogram dated 06/22/2017          Labs - reviewed              No results found for: CHOL, HDL, 100065, [id number], LDLC, VLDL        Care Plan Discussion     [x]  Patient   [] Family   [] Other Physician :        Counseling Performed     Dietary Counseling and Exercise        Records Requested / Reviewed     Hospital records / Records from other physicians reviewed - summary as above. Copies in chart

## 2018-03-09 NOTE — Progress Notes (Signed)
Perminder Lynnell Dike, MD [facility]       Mitzi Hansen, MD [facility]     Doris Cheadle, MD [facility]      Norberta Keens Wyline Mood, MD [facility]     Hilary Hertz, MD [facility]       Santiago Bumpers, MD [facility] Office: [phone]       [place] [phone]  ______________________________________________________________________    Impressions/Visit Diagnoses:  Patient Active Problem List    Diagnosis Date Noted   ??? Swelling of lower extremity 06/11/2017   ??? Essential hypertension 06/03/2016   ??? Type 2 diabetes mellitus (HCC) 06/03/2016   ??? Aortic dilatation (HCC) - very mild (3.4cm) on CTA (05/2015) 06/03/2016   ??? Morbid obesity due to excess calories (HCC) 06/03/2016               Recommendations/Plan:  1. EKG was reviewed with the patient and shows normal sinus rhythm and is within normal limits  2. Blood pressure is well controlled on current regimen of medications  3. Diet and exercise were counseled with the patient  4. F/u in 3 months      I thank you for giving me opportunity to participate in care of Tiffany Harmon. I will update you with further plan of Her care at the time of follow up, mean while, should there be any questions or comments about Her management, please do not hesitate to contact me.    Sincerely,  Thurnell Garbe, MD      ______________________________________________________________________      No change in exercise capacity, No CP/SOB/Dizziness or Palpitations.    Past Medical History:   Diagnosis Date   ??? Asthma    ??? Diabetes (HCC)    ??? Hypertension    ??? Thyroid disease    ??? Tibia fracture 02/2017       No past surgical history on file.        Allergies   Allergen Reactions   ??? Aspirin Unknown (comments)     Headache    ??? Codeine Nausea and Vomiting       Family History   Problem Relation Age of Onset   ??? Cancer Father 64        Breast  & prostate   ??? Cancer Son 5        hodgkins / lymphoma       Social History     Socioeconomic History   ??? Marital status: MARRIED      Spouse name: Not on file   ??? Number of children: Not on file   ??? Years of education: Not on file   ??? Highest education level: Not on file   Occupational History   ??? Not on file   Social Needs   ??? Financial resource strain: Not on file   ??? Food insecurity:     Worry: Not on file     Inability: Not on file   ??? Transportation needs:     Medical: Not on file     Non-medical: Not on file   Tobacco Use   ??? Smoking status: Never Smoker   ??? Smokeless tobacco: Never Used   Substance and Sexual Activity   ??? Alcohol use: Yes     Comment: socially   ??? Drug use: No   ??? Sexual activity: Not on file   Lifestyle   ??? Physical activity:     Days per week:  Not on file     Minutes per session: Not on file   ??? Stress: Not on file   Relationships   ??? Social connections:     Talks on phone: Not on file     Gets together: Not on file     Attends religious service: Not on file     Active member of club or organization: Not on file     Attends meetings of clubs or organizations: Not on file     Relationship status: Not on file   ??? Intimate partner violence:     Fear of current or ex partner: Not on file     Emotionally abused: Not on file     Physically abused: Not on file     Forced sexual activity: Not on file   Other Topics Concern   ??? Not on file   Social History Narrative   ??? Not on file         Review of Systems   Constitutional: Negative for chills, diaphoresis, fever, malaise/fatigue and weight loss.   Respiratory: Negative for cough and shortness of breath.    Cardiovascular: Negative for chest pain, palpitations, orthopnea and leg swelling.   Gastrointestinal: Negative for abdominal pain, diarrhea, heartburn, nausea and vomiting.   Neurological: Negative for dizziness, loss of consciousness and weakness.           Physical Exam:      Visit Vitals  BP 120/80 (BP 1 Location: Left arm, BP Patient Position: Sitting)   Pulse 65   Resp 18   Ht 5\' 3"  (1.6 m)   Wt 224 lb (101.6 kg)   BMI 39.68 kg/m??       Physical Exam:     General: Alert, comfortable   HEENT: Normocephalic, atraumatic   Neck: No JVD, No Bruits   Chest wall: NL excursion   Lungs: Clear B/L   CV: NL S1, S2, no murmurs, gallops   Abdomen: Soft, NL BS, No distention   Extremity:No clubbing, cyanosis, trace edema   Neuro: Alert, oriented   Skin: No open sores, bruises   Pulses:  2+ dorsalis pedis    Tests - Procedures Reviewed    EKG:  NSR, WNL  Echocardiogram dated 06/22/2017    Labs - reviewed         No results found for: CHOL, HDL, 100065, [id number], LDLC, VLDL    Care Plan Discussion   [x] Patient   [] Family   [] Other Physician :    Counseling Performed   Dietary Counseling and Exercise    Records Requested / Reviewed   Hospital records / Records from other physicians reviewed - summary as above. Copies in chart

## 2018-09-06 ENCOUNTER — Ambulatory Visit: Attending: Interventional Cardiology

## 2018-09-06 ENCOUNTER — Ambulatory Visit: Admit: 2018-09-06 | Discharge: 2018-09-06 | Payer: MEDICARE | Attending: Interventional Cardiology

## 2018-09-06 DIAGNOSIS — I10 Essential (primary) hypertension: Secondary | ICD-10-CM

## 2018-09-06 MED ORDER — AMLODIPINE 5 MG TAB
5 mg | ORAL_TABLET | Freq: Every day | ORAL | 3 refills | Status: DC
Start: 2018-09-06 — End: 2019-06-26

## 2018-09-06 NOTE — Progress Notes (Signed)
Progress  Notes by Thurnell Garbe, MD at 09/06/18 1100                Author: Thurnell Garbe, MD  Service: --  Author Type: Physician       Filed: 09/06/18 1142  Encounter Date: 09/06/2018  Status: Signed          Editor: Thurnell Garbe, MD (Physician)                       Perminder Lynnell Dike, MD [facility]       Mitzi Hansen, MD [facility]      Doris Cheadle, MD [facility]      Rosemary Holms, MD [facility]      Hilary Hertz, MD [facility]       Santiago Bumpers, MD [facility] Office: [phone]       [place] [phone]   ______________________________________________________________________      Impressions/Visit Diagnoses:     Patient Active Problem List           Diagnosis  Date Noted         ?  Swelling of lower extremity  06/11/2017     ?  Essential hypertension  06/03/2016     ?  Type 2 diabetes mellitus (HCC)  06/03/2016     ?  Aortic dilatation (HCC) - very mild (3.4cm) on CTA (05/2015)  06/03/2016         ?  Morbid obesity due to excess calories (HCC)  06/03/2016                       Recommendations/Plan:   1.  EKG was reviewed with the patient and shows normal sinus rhythm and is within normal limits   2.  Blood pressure is well controlled on current regimen of medications   3.  Diet and exercise were counseled with the patient   4.  F/u in 3 months         I thank you for giving me opportunity to participate in care of Marva Hendryx. I will update you with further plan of Her care at the time of follow up, mean while, should there be any questions or comments about Her management, please do not hesitate  to contact me.      Sincerely,   Thurnell Garbe, MD         ______________________________________________________________________         No change in exercise capacity, No CP/SOB/Dizziness or Palpitations.        Past Medical History:        Diagnosis  Date         ?  Asthma       ?  Diabetes (HCC)       ?  Hypertension       ?  Thyroid disease           ?  Tibia fracture  02/2017            No past surgical history on file.              Allergies        Allergen  Reactions         ?  Aspirin  Unknown (comments)             Headache          ?  Codeine  Nausea and Vomiting             Family History         Problem  Relation  Age of Onset          ?  Cancer  Father  3570              Breast  & prostate          ?  Cancer  Son  4334              hodgkins / lymphoma             Social History          Socioeconomic History         ?  Marital status:  MARRIED              Spouse name:  Not on file         ?  Number of children:  Not on file     ?  Years of education:  Not on file     ?  Highest education level:  Not on file       Occupational History        ?  Not on file       Social Needs         ?  Financial resource strain:  Not on file        ?  Food insecurity:              Worry:  Not on file         Inability:  Not on file        ?  Transportation needs:              Medical:  Not on file         Non-medical:  Not on file       Tobacco Use         ?  Smoking status:  Never Smoker     ?  Smokeless tobacco:  Never Used       Substance and Sexual Activity         ?  Alcohol use:  Yes             Comment: socially         ?  Drug use:  No     ?  Sexual activity:  Not on file       Lifestyle        ?  Physical activity:              Days per week:  Not on file         Minutes per session:  Not on file         ?  Stress:  Not on file       Relationships        ?  Social connections:              Talks on phone:  Not on file         Gets together:  Not on file         Attends religious service:  Not on file         Active member of club or organization:  Not on file         Attends meetings of clubs or organizations:  Not on file  Relationship status:  Not on file        ?  Intimate partner violence:              Fear of current or ex partner:  Not on file         Emotionally abused:  Not on file         Physically abused:  Not on file         Forced sexual activity:  Not on file        Other  Topics  Concern        ?  Not on file       Social History Narrative        ?  Not on file              Review of Systems    Constitutional: Negative for chills, diaphoresis, fever, malaise/fatigue and weight loss.    Respiratory: Negative for cough and shortness of breath.     Cardiovascular: Negative for chest pain, palpitations, orthopnea and leg swelling.    Gastrointestinal: Negative for abdominal pain, diarrhea, heartburn, nausea and vomiting.    Neurological: Negative for dizziness, loss of consciousness and weakness.                  Physical Exam:         Visit Vitals      BP  120/70 (BP 1 Location: Left arm, BP Patient Position: Sitting)     Pulse  69     Resp  17     Ht  5\' 3"  (1.6 m)     Wt  219 lb (99.3 kg)        BMI  38.79 kg/m??           Physical Exam:     General: Alert, comfortable    HEENT: Normocephalic, atraumatic    Neck: No JVD, No Bruits    Chest wall: NL excursion    Lungs: Clear B/L    CV: NL S1, S2, no murmurs, gallops    Abdomen: Soft, NL BS, No distention    Extremity:No clubbing, cyanosis, trace edema    Neuro: Alert, oriented    Skin: No open sores, bruises    Pulses:  2+ dorsalis pedis        Tests - Procedures Reviewed      EKG:  NSR, WNL      Echocardiogram dated 06/22/2017          Labs - reviewed              No results found for: CHOL, HDL, 100065, [id number], LDLC, VLDL        Care Plan Discussion     [x]  Patient   [] Family   [] Other Physician :        Counseling Performed     Dietary Counseling and Exercise        Records Requested / Reviewed     Hospital records / Records from other physicians reviewed - summary as above. Copies in chart

## 2018-09-06 NOTE — Progress Notes (Signed)
Perminder Lynnell Dike, MD [facility]       Mitzi Hansen, MD [facility]     Doris Cheadle, MD  [facility]      Norberta Keens Wyline Mood, MD [facility]     Hilary Hertz, MD [facility]       Santiago Bumpers, MD [facility] Office: [phone]       [place] [phone]  ______________________________________________________________________    Impressions/Visit Diagnoses:  Patient Active Problem List    Diagnosis Date Noted   ??? Swelling of lower extremity 06/11/2017   ??? Essential hypertension 06/03/2016   ??? Type 2 diabetes mellitus (HCC) 06/03/2016   ??? Aortic dilatation (HCC) - very mild (3.4cm) on CTA (05/2015) 06/03/2016   ??? Morbid obesity due to excess calories (HCC) 06/03/2016               Recommendations/Plan:  1. EKG was reviewed with the patient and shows normal sinus rhythm and is within normal limits  2. Blood pressure is well controlled on current regimen of medications  3. Diet and exercise were counseled with the patient  4. F/u in 3 months      I thank you for giving me opportunity to participate in care of Tiffany Harmon. I will update you with further plan of Her care at the time of follow up, mean while, should there be any questions or comments about Her management, please do not hesitate to contact me.    Sincerely,  Thurnell Garbe, MD      ______________________________________________________________________      No change in exercise capacity, No CP/SOB/Dizziness or Palpitations.    Past Medical History:   Diagnosis Date   ??? Asthma    ??? Diabetes (HCC)    ??? Hypertension    ??? Thyroid disease    ??? Tibia fracture 02/2017       No past surgical history on file.        Allergies   Allergen Reactions   ??? Aspirin Unknown (comments)     Headache    ??? Codeine Nausea and Vomiting       Family History   Problem Relation Age of Onset   ??? Cancer Father 18        Breast  & prostate   ??? Cancer Son 49        hodgkins / lymphoma       Social History     Socioeconomic History   ??? Marital status: MARRIED      Spouse name: Not on file   ??? Number of children: Not on file   ??? Years of education: Not on file   ??? Highest education level: Not on file   Occupational History   ??? Not on file   Social Needs   ??? Financial resource strain: Not on file   ??? Food insecurity:     Worry: Not on file     Inability: Not on file   ??? Transportation needs:     Medical: Not on file     Non-medical: Not on file   Tobacco Use   ??? Smoking status: Never Smoker   ??? Smokeless tobacco: Never Used   Substance and Sexual Activity   ??? Alcohol use: Yes     Comment: socially   ??? Drug use: No   ??? Sexual activity: Not on file   Lifestyle   ??? Physical activity:     Days per week:  Not on file     Minutes per session: Not on file   ??? Stress: Not on file   Relationships   ??? Social connections:     Talks on phone: Not on file     Gets together: Not on file     Attends religious service: Not on file     Active member of club or organization: Not on file     Attends meetings of clubs or organizations: Not on file     Relationship status: Not on file   ??? Intimate partner violence:     Fear of current or ex partner: Not on file     Emotionally abused: Not on file     Physically abused: Not on file     Forced sexual activity: Not on file   Other Topics Concern   ??? Not on file   Social History Narrative   ??? Not on file         Review of Systems   Constitutional: Negative for chills, diaphoresis, fever, malaise/fatigue and weight loss.   Respiratory: Negative for cough and shortness of breath.    Cardiovascular: Negative for chest pain, palpitations, orthopnea and leg swelling.   Gastrointestinal: Negative for abdominal pain, diarrhea, heartburn, nausea and vomiting.   Neurological: Negative for dizziness, loss of consciousness and weakness.           Physical Exam:      Visit Vitals  BP 120/70 (BP 1 Location: Left arm, BP Patient Position: Sitting)   Pulse 69   Resp 17   Ht 5\' 3"  (1.6 m)   Wt 219 lb (99.3 kg)   BMI 38.79 kg/m??       Physical Exam:     General: Alert, comfortable   HEENT: Normocephalic, atraumatic   Neck: No JVD, No Bruits   Chest wall: NL excursion   Lungs: Clear B/L   CV: NL S1, S2, no murmurs, gallops   Abdomen: Soft, NL BS, No distention   Extremity:No clubbing, cyanosis, trace edema   Neuro: Alert, oriented   Skin: No open sores, bruises   Pulses:  2+ dorsalis pedis    Tests - Procedures Reviewed    EKG:  NSR, WNL    Echocardiogram dated 06/22/2017    Labs - reviewed         No results found for: CHOL, HDL, 100065, [id number], LDLC, VLDL    Care Plan Discussion   [x] Patient   [] Family   [] Other Physician :    Counseling Performed   Dietary Counseling and Exercise    Records Requested / Reviewed   Hospital records / Records from other physicians reviewed - summary as above. Copies in chart

## 2018-11-18 NOTE — Telephone Encounter (Signed)
Requested Prescriptions     Pending Prescriptions Disp Refills   ??? valsartan (DIOVAN) 320 mg tablet 90 Tab 1     Sig: TAKE 1 TABLET BY MOUTH EVERY DAY

## 2018-11-21 MED ORDER — VALSARTAN 320 MG TAB
320 mg | ORAL_TABLET | ORAL | 1 refills | Status: DC
Start: 2018-11-21 — End: 2019-05-09

## 2019-03-07 ENCOUNTER — Encounter: Attending: Interventional Cardiology

## 2019-03-17 MED ORDER — CHLORTHALIDONE 25 MG TAB
25 mg | ORAL_TABLET | Freq: Every day | ORAL | 0 refills | Status: DC
Start: 2019-03-17 — End: 2019-09-08

## 2019-03-17 NOTE — Telephone Encounter (Signed)
Requested Prescriptions     Pending Prescriptions Disp Refills   ??? chlorthalidone (HYGROTEN) 25 mg tablet 90 Tab 0     Sig: Take 1 Tab by mouth daily.

## 2019-04-05 ENCOUNTER — Ambulatory Visit: Attending: Interventional Cardiology

## 2019-04-05 ENCOUNTER — Ambulatory Visit: Admit: 2019-04-05 | Discharge: 2019-04-05 | Payer: MEDICARE | Attending: Interventional Cardiology

## 2019-04-05 DIAGNOSIS — I10 Essential (primary) hypertension: Secondary | ICD-10-CM

## 2019-04-05 NOTE — Progress Notes (Signed)
Progress  Notes by Donato Schultz, MD at 04/05/19 1430                Author: Donato Schultz, MD  Service: --  Author Type: Physician       Filed: 04/05/19 1531  Encounter Date: 04/05/2019  Status: Signed          Editor: Donato Schultz, MD (Physician)                       Perminder Christen Butter, MD [facility]       Arlana Hove, MD [place], MD [facility]      Lorenza Burton, MD [place], MD [place], MD [facility] Office: [phone]       [place] [phone]   ______________________________________________________________________      Impressions/Visit Diagnoses:     Patient Active Problem List           Diagnosis  Date Noted         ?  Swelling of lower extremity  06/11/2017     ?  Essential hypertension  06/03/2016     ?  Type 2 diabetes mellitus ([place])  06/03/2016     ?  Aortic dilatation (HCC) - very mild (3.4cm) on CTA (05/2015)  06/03/2016         ?  Morbid obesity due to excess calories ([place])  06/03/2016                       Recommendations/Plan:   1.  EKG was reviewed with the patient and shows normal sinus rhythm and is within normal limits   2.  Blood pressure is well controlled on current regimen of medications   3.  Diet and exercise were counseled with the patient   4.  F/u in 6 months         I thank you for giving me opportunity to participate in care of Tiffany Harmon. I will update you with further plan of Her care at the time of follow up, mean while, should there be any questions or comments about Her management, please do not hesitate  to contact me.      Sincerely,   Donato Schultz, MD         ______________________________________________________________________         No change in exercise capacity, No CP/SOB/Dizziness or Palpitations.  No N/V/Vomiting        Past Medical History:        Diagnosis  Date         ?  Asthma       ?  Diabetes ([place])       ?  Hypertension       ?  Thyroid disease           ?  Tibia  fracture  02/2017           No past surgical history on file.              Allergies        Allergen  Reactions         ?  Aspirin  Unknown (comments)             Headache          ?  Codeine  Nausea and Vomiting             Family History         Problem  Relation  Age of Onset          ?  Cancer  Father  2670              Breast  & prostate          ?  Cancer  Son  4834              hodgkins / lymphoma             Social History          Socioeconomic History         ?  Marital status:  MARRIED              Spouse name:  Not on file         ?  Number of children:  Not on file     ?  Years of education:  Not on file     ?  Highest education level:  Not on file       Occupational History        ?  Not on file       Social Needs         ?  Financial resource strain:  Not on file        ?  Food insecurity              Worry:  Not on file         Inability:  Not on file        ?  Transportation needs              Medical:  Not on file         Non-medical:  Not on file       Tobacco Use         ?  Smoking status:  Never Smoker     ?  Smokeless tobacco:  Never Used       Substance and Sexual Activity         ?  Alcohol use:  Yes             Comment: socially         ?  Drug use:  No     ?  Sexual activity:  Not on file       Lifestyle        ?  Physical activity              Days per week:  Not on file         Minutes per session:  Not on file         ?  Stress:  Not on file       Relationships        ?  Social Engineer, manufacturing systemsconnections              Talks on phone:  Not on file         Gets together:  Not on file         Attends religious service:  Not on file         Active member of club or organization:  Not on file         Attends meetings of clubs or organizations:  Not on file  Relationship status:  Not on file        ?  Intimate partner violence              Fear of current or ex partner:  Not on file         Emotionally abused:  Not on file         Physically abused:  Not on file         Forced sexual activity:  Not on file         Other Topics  Concern        ?  Not on file       Social History Narrative        ?  Not on file              Review of Systems    Constitutional: Negative for chills, diaphoresis, fever, malaise/fatigue and weight loss.    Respiratory: Negative for cough and shortness of breath.     Cardiovascular: Negative for chest pain, palpitations, orthopnea and leg swelling.    Gastrointestinal: Negative for abdominal pain, diarrhea, heartburn, nausea and vomiting.    Neurological: Negative for dizziness, loss of consciousness and weakness.                  Physical Exam:         Visit Vitals      BP  136/82 (BP 1 Location: Right arm, BP Patient Position: Sitting)     Pulse  64     Temp  98.3 ??F (36.8 ??C)     Resp  18     Ht  5\' 3"  (1.6 m)     Wt  200 lb (90.7 kg)        BMI  35.43 kg/m??           Physical Exam:     General: Alert, comfortable    HEENT: Normocephalic, atraumatic    Neck: No JVD, No Bruits    Chest wall: NL excursion    Lungs: Clear B/L    CV: NL S1, S2, no murmurs, gallops    Abdomen: Soft, NL BS, No distention    Extremity:No clubbing, cyanosis, trace edema    Neuro: Alert, oriented    Skin: No open sores, bruises    Pulses:  2+ dorsalis pedis        Tests - Procedures Reviewed      EKG:  NSR, WNL 04/05/19      Echocardiogram dated 06/22/2017          Labs - reviewed              No results found for: CHOL, HDL, 100065, [id number], LDLC, VLDL        Care Plan Discussion     [x]  Patient   [] Family   [] Other Physician :        Counseling Performed     Dietary Counseling and Exercise        Records Requested / Reviewed     Hospital records / Records from other physicians reviewed - summary as above. Copies in chart

## 2019-04-05 NOTE — Progress Notes (Signed)
Perminder Lynnell DikeS Grewal, MD [facility]       Mitzi HansenArvind K Agarwal, MD [facility]     Doris CheadleBangalore S Sridhara, MD [facility]      Norberta KeensSunandan Wyline MoodA Pandya, MD [facility]     Hilary HertzShital R Casin Federici, MD [facility]       Santiago Bumpersajiv Singh, MD [facility]       [place] Office: [phone]       [place] [phone]  ______________________________________________________________________    Impressions/Visit Diagnoses:  Patient Active Problem List    Diagnosis Date Noted   ??? Swelling of lower extremity 06/11/2017   ??? Essential hypertension 06/03/2016   ??? Type 2 diabetes mellitus (HCC) 06/03/2016   ??? Aortic dilatation (HCC) - very mild (3.4cm) on CTA (05/2015) 06/03/2016   ??? Morbid obesity due to excess calories (HCC) 06/03/2016               Recommendations/Plan:  1. EKG was reviewed with the patient and shows normal sinus rhythm and is within normal limits  2. Blood pressure is well controlled on current regimen of medications  3. Diet and exercise were counseled with the patient  4. F/u in 6 months      I thank you for giving me opportunity to participate in care of Tiffany KassCheryl Harmon. I will update you with further plan of Her care at the time of follow up, mean while, should there be any questions or comments about Her management, please do not hesitate to contact me.    Sincerely,  Thurnell GarbeShital Hazelynn Mckenny, MD      ______________________________________________________________________      No change in exercise capacity, No CP/SOB/Dizziness or Palpitations.  No N/V/Vomiting    Past Medical History:   Diagnosis Date   ??? Asthma    ??? Diabetes (HCC)    ??? Hypertension    ??? Thyroid disease    ??? Tibia fracture 02/2017       No past surgical history on file.        Allergies   Allergen Reactions   ??? Aspirin Unknown (comments)     Headache    ??? Codeine Nausea and Vomiting       Family History   Problem Relation Age of Onset   ??? Cancer Father 2870        Breast  & prostate   ??? Cancer Son 5234        hodgkins / lymphoma       Social History     Socioeconomic History    ??? Marital status: MARRIED     Spouse name: Not on file   ??? Number of children: Not on file   ??? Years of education: Not on file   ??? Highest education level: Not on file   Occupational History   ??? Not on file   Social Needs   ??? Financial resource strain: Not on file   ??? Food insecurity     Worry: Not on file     Inability: Not on file   ??? Transportation needs     Medical: Not on file     Non-medical: Not on file   Tobacco Use   ??? Smoking status: Never Smoker   ??? Smokeless tobacco: Never Used   Substance and Sexual Activity   ??? Alcohol use: Yes     Comment: socially   ??? Drug use: No   ??? Sexual activity: Not on file   Lifestyle   ??? Physical activity  Days per week: Not on file     Minutes per session: Not on file   ??? Stress: Not on file   Relationships   ??? Social Product manager on phone: Not on file     Gets together: Not on file     Attends religious service: Not on file     Active member of club or organization: Not on file     Attends meetings of clubs or organizations: Not on file     Relationship status: Not on file   ??? Intimate partner violence     Fear of current or ex partner: Not on file     Emotionally abused: Not on file     Physically abused: Not on file     Forced sexual activity: Not on file   Other Topics Concern   ??? Not on file   Social History Narrative   ??? Not on file         Review of Systems   Constitutional: Negative for chills, diaphoresis, fever, malaise/fatigue and weight loss.   Respiratory: Negative for cough and shortness of breath.    Cardiovascular: Negative for chest pain, palpitations, orthopnea and leg swelling.   Gastrointestinal: Negative for abdominal pain, diarrhea, heartburn, nausea and vomiting.   Neurological: Negative for dizziness, loss of consciousness and weakness.           Physical Exam:      Visit Vitals  BP 136/82 (BP 1 Location: Right arm, BP Patient Position: Sitting)   Pulse 64   Temp 98.3 ??F (36.8 ??C)   Resp 18   Ht 5\' 3"  (1.6 m)   Wt 200 lb (90.7 kg)    BMI 35.43 kg/m??       Physical Exam:    General: Alert, comfortable   HEENT: Normocephalic, atraumatic   Neck: No JVD, No Bruits   Chest wall: NL excursion   Lungs: Clear B/L   CV: NL S1, S2, no murmurs, gallops   Abdomen: Soft, NL BS, No distention   Extremity:No clubbing, cyanosis, trace edema   Neuro: Alert, oriented   Skin: No open sores, bruises   Pulses:  2+ dorsalis pedis    Tests - Procedures Reviewed    EKG:  NSR, WNL 04/05/19    Echocardiogram dated 06/22/2017    Labs - reviewed         No results found for: CHOL, HDL, 100065, [id number], LDLC, VLDL    Care Plan Discussion   [x] Patient   [] Family   [] Other Physician :    Counseling Performed   Dietary Counseling and Exercise    Records Requested / [facility] records / Records from other physicians reviewed - summary as above. Copies in chart

## 2019-05-09 MED ORDER — VALSARTAN 320 MG TAB
320 mg | ORAL_TABLET | ORAL | 1 refills | Status: DC
Start: 2019-05-09 — End: 2019-12-04

## 2019-05-09 NOTE — Telephone Encounter (Signed)
Requested Prescriptions     Pending Prescriptions Disp Refills   ??? valsartan (DIOVAN) 320 mg tablet 90 Tab 1     Sig: TAKE 1 TABLET BY MOUTH EVERY DAY

## 2019-06-26 MED ORDER — AMLODIPINE 5 MG TAB
5 mg | ORAL_TABLET | Freq: Every day | ORAL | 1 refills | Status: DC
Start: 2019-06-26 — End: 2019-09-08

## 2019-06-26 NOTE — Telephone Encounter (Signed)
Requested Prescriptions     Pending Prescriptions Disp Refills   ??? amLODIPine (NORVASC) 5 mg tablet 90 Tab 1     Sig: Take 1 Tab by mouth daily.

## 2019-09-08 ENCOUNTER — Ambulatory Visit: Attending: Interventional Cardiology

## 2019-09-08 ENCOUNTER — Ambulatory Visit: Admit: 2019-09-08 | Discharge: 2019-09-08 | Payer: MEDICARE | Attending: Interventional Cardiology

## 2019-09-08 DIAGNOSIS — I77819 Aortic ectasia, unspecified site: Secondary | ICD-10-CM

## 2019-09-08 MED ORDER — AMLODIPINE 5 MG TAB
5 mg | ORAL_TABLET | Freq: Every day | ORAL | 3 refills | Status: DC
Start: 2019-09-08 — End: 2020-02-23

## 2019-09-08 MED ORDER — CHLORTHALIDONE 25 MG TAB
25 mg | ORAL_TABLET | Freq: Every day | ORAL | 3 refills | Status: DC
Start: 2019-09-08 — End: 2020-09-13

## 2019-09-08 NOTE — Progress Notes (Signed)
Progress  Notes by Thurnell Garbe, MD at 09/08/19 1030                Author: Thurnell Garbe, MD  Service: --  Author Type: Physician       Filed: 09/08/19 1048  Encounter Date: 09/08/2019  Status: Addendum          Editor: Thurnell Garbe, MD (Physician)          Related Notes: Original Note by Thurnell Garbe, MD (Physician) filed at 09/08/19 1046                       Perminder Lynnell Dike, MD [facility]       Mitzi Hansen, MD [facility]      Doris Cheadle, MD [facility]      Rosemary Holms, MD [facility]      Hilary Hertz, MD [facility]       Santiago Bumpers, MD [facility] Office: [phone]       [place] [phone]   ______________________________________________________________________      Impressions/Visit Diagnoses:     Patient Active Problem List           Diagnosis  Date Noted         ?  Swelling of lower extremity  06/11/2017     ?  Essential hypertension  06/03/2016     ?  Type 2 diabetes mellitus (HCC)  06/03/2016     ?  Aortic dilatation (HCC) - very mild (3.4cm) on CTA (05/2015)  06/03/2016         ?  Morbid obesity due to excess calories (HCC)  06/03/2016                       Recommendations/Plan:   1.  EKG was reviewed with the patient and shows normal sinus rhythm and is within normal limits   2.  Blood pressure is well controlled - Rx renewed - no change in medications   3.  Diet and exercise were counseled with the patient - wt gain on 9 lbs since last visit   4.  Blood work from 10/20 reviewed - LDL 102 with total chol 161 and HDL of 50   5.  F/u in 6 month         I thank you for giving me opportunity to participate in care of Tiffany Harmon. I will update you with further plan of Her care at the time of follow up, mean while, should there be any questions or comments about Her management, please do not hesitate  to contact me.      Sincerely,   Thurnell Garbe, MD         ______________________________________________________________________         No change in exercise  capacity, No CP/SOB/Dizziness or Palpitations.  No N/V/Fevers        Past Medical History:        Diagnosis  Date         ?  Asthma       ?  Diabetes (HCC)       ?  Hypertension       ?  Thyroid disease           ?  Tibia fracture  02/2017           History reviewed. No pertinent surgical history.  Allergies        Allergen  Reactions         ?  Aspirin  Unknown (comments)             Headache          ?  Codeine  Nausea and Vomiting             Family History         Problem  Relation  Age of Onset          ?  Cancer  Father  57              Breast  & prostate          ?  Cancer  Son  64              hodgkins / lymphoma             Social History          Socioeconomic History         ?  Marital status:  MARRIED              Spouse name:  Not on file         ?  Number of children:  Not on file     ?  Years of education:  Not on file     ?  Highest education level:  Not on file       Occupational History        ?  Not on file       Social Needs         ?  Financial resource strain:  Not on file        ?  Food insecurity              Worry:  Not on file         Inability:  Not on file        ?  Transportation needs              Medical:  Not on file         Non-medical:  Not on file       Tobacco Use         ?  Smoking status:  Never Smoker     ?  Smokeless tobacco:  Never Used       Substance and Sexual Activity         ?  Alcohol use:  Yes             Comment: socially         ?  Drug use:  No     ?  Sexual activity:  Not on file       Lifestyle        ?  Physical activity              Days per week:  Not on file         Minutes per session:  Not on file         ?  Stress:  Not on file       Relationships        ?  Social Health visitor on phone:  Not on file         Gets together:  Not on file  Attends religious service:  Not on file         Active member of club or organization:  Not on file         Attends meetings of clubs or organizations:  Not on file         Relationship  status:  Not on file        ?  Intimate partner violence              Fear of current or ex partner:  Not on file         Emotionally abused:  Not on file         Physically abused:  Not on file         Forced sexual activity:  Not on file        Other Topics  Concern        ?  Not on file       Social History Narrative        ?  Not on file              Review of Systems    Constitutional: Negative for chills, diaphoresis, fever, malaise/fatigue and weight loss.    Respiratory: Negative for cough and shortness of breath.     Cardiovascular: Negative for chest pain, palpitations, orthopnea and leg swelling.    Gastrointestinal: Negative for abdominal pain, diarrhea, heartburn, nausea and vomiting.    Neurological: Negative for dizziness, loss of consciousness and weakness.                  Physical Exam:         Visit Vitals      BP  128/70 (BP 1 Location: Right arm, BP Patient Position: Sitting)     Pulse  (!) 58     Ht  5\' 3"  (1.6 m)     Wt  209 lb (94.8 kg)        BMI  37.02 kg/m??           Physical Exam:     General: Alert, comfortable    HEENT: Normocephalic, atraumatic    Neck: No JVD, No Bruits    Chest wall: NL excursion    Lungs: Clear B/L    CV: NL S1, S2, no murmurs, gallops    Abdomen: Soft, NL BS, No distention    Extremity:No clubbing, cyanosis, trace edema    Neuro: Alert, oriented    Skin: No open sores, bruises    Pulses:  2+ dorsalis pedis        Tests - Procedures Reviewed      EKG: 09/08/19  NSR, WNL       Echocardiogram dated 06/22/2017          Labs - reviewed              No results found for: CHOL, HDL, 100065, [id number], LDLC, VLDL        Care Plan Discussion     [x]  Patient   [] Family   [] Other Physician :        Counseling Performed     Dietary Counseling and Exercise        Records Requested / Reviewed     Hospital records / Records from other physicians reviewed - summary as above. Copies in chart

## 2019-09-08 NOTE — Progress Notes (Addendum)
Perminder Christen Butter, MD [facility]       Arlana Hove, MD Lino Lakes, MD [facility]      Voncille Lo Carolynne Edouard, MD [place], MD [place], MD [facility] Office: [phone]       [place] [phone]  ______________________________________________________________________    Impressions/Visit Diagnoses:  Patient Active Problem List    Diagnosis Date Noted   ??? Swelling of lower extremity 06/11/2017   ??? Essential hypertension 06/03/2016   ??? Type 2 diabetes mellitus ([place]) 06/03/2016   ??? Aortic dilatation (HCC) - very mild (3.4cm) on CTA (05/2015) 06/03/2016   ??? Morbid obesity due to excess calories ([place]) 06/03/2016               Recommendations/Plan:  1. EKG was reviewed with the patient and shows normal sinus rhythm and is within normal limits  2. Blood pressure is well controlled - Rx renewed - no change in medications  3. Diet and exercise were counseled with the patient - wt gain on 9 lbs since last visit  4. Blood work from 10/20 reviewed - LDL 102 with total chol 161 and HDL of 50  5. F/u in 6 month      I thank you for giving me opportunity to participate in care of Posey Jasmin. I will update you with further plan of Her care at the time of follow up, mean while, should there be any questions or comments about Her management, please do not hesitate to contact me.    Sincerely,  Donato Schultz, MD      ______________________________________________________________________      No change in exercise capacity, No CP/SOB/Dizziness or Palpitations.  No N/V/Fevers    Past Medical History:   Diagnosis Date   ??? Asthma    ??? Diabetes ([place])    ??? Hypertension    ??? Thyroid disease    ??? Tibia fracture 02/2017       History reviewed. No pertinent surgical history.        Allergies   Allergen Reactions   ??? Aspirin Unknown (comments)     Headache    ??? Codeine Nausea and Vomiting       Family History   Problem Relation Age of Onset   ??? Cancer Father 69         Breast  & prostate   ??? Cancer Son 49        hodgkins / lymphoma       Social History     Socioeconomic History   ??? Marital status: MARRIED     Spouse name: Not on file   ??? Number of children: Not on file   ??? Years of education: Not on file   ??? Highest education level: Not on file   Occupational History   ??? Not on file   Social Needs   ??? Financial resource strain: Not on file   ??? Food insecurity     Worry: Not on file     Inability: Not on file   ??? Transportation needs     Medical: Not on file     Non-medical: Not on file   Tobacco Use   ??? Smoking status: Never Smoker   ??? Smokeless tobacco: Never Used   Substance and Sexual Activity   ??? Alcohol use: Yes  Comment: socially   ??? Drug use: No   ??? Sexual activity: Not on file   Lifestyle   ??? Physical activity     Days per week: Not on file     Minutes per session: Not on file   ??? Stress: Not on file   Relationships   ??? Social Wellsite geologist on phone: Not on file     Gets together: Not on file     Attends religious service: Not on file     Active member of club or organization: Not on file     Attends meetings of clubs or organizations: Not on file     Relationship status: Not on file   ??? Intimate partner violence     Fear of current or ex partner: Not on file     Emotionally abused: Not on file     Physically abused: Not on file     Forced sexual activity: Not on file   Other Topics Concern   ??? Not on file   Social History Narrative   ??? Not on file         Review of Systems   Constitutional: Negative for chills, diaphoresis, fever, malaise/fatigue and weight loss.   Respiratory: Negative for cough and shortness of breath.    Cardiovascular: Negative for chest pain, palpitations, orthopnea and leg swelling.   Gastrointestinal: Negative for abdominal pain, diarrhea, heartburn, nausea and vomiting.   Neurological: Negative for dizziness, loss of consciousness and weakness.           Physical Exam:      Visit Vitals  BP 128/70 (BP 1 Location: Right arm, BP Patient  Position: Sitting)   Pulse (!) 58   Ht 5\' 3"  (1.6 m)   Wt 209 lb (94.8 kg)   BMI 37.02 kg/m??       Physical Exam:    General: Alert, comfortable   HEENT: Normocephalic, atraumatic   Neck: No JVD, No Bruits   Chest wall: NL excursion   Lungs: Clear B/L   CV: NL S1, S2, no murmurs, gallops   Abdomen: Soft, NL BS, No distention   Extremity:No clubbing, cyanosis, trace edema   Neuro: Alert, oriented   Skin: No open sores, bruises   Pulses:  2+ dorsalis pedis    Tests - Procedures Reviewed    EKG: 09/08/19  NSR, WNL     Echocardiogram dated 06/22/2017    Labs - reviewed         No results found for: CHOL, HDL, 100065, 06/24/2017, LDLC, VLDL    Care Plan Discussion   [x] Patient   [] Family   [] Other Physician :    Counseling Performed   Dietary Counseling and Exercise    Records Requested / Reviewed   Hospital records / Records from other physicians reviewed - summary as above. Copies in chart

## 2019-11-27 IMAGING — CT CT SINUS WITHOUT CONTRAST
2 series · 14 of 37 positions shown, 17 images · non-contrast
Comparison: There are no previous exams available for comparison.

CT SINUS WITHOUT CONTRAST, 11/27/2019 [DATE]: 
CLINICAL INDICATION:  Chronic maxillary sinusitis 
A search for DICOM formatted images was conducted for prior CT imaging studies 
completed at a non-affiliated media free facility.
TECHNIQUE: The paranasal sinuses were scanned without contrast on a high 
resolution CT scanner using dose reduction techniques.  Routine MPR 
reconstructions were performed.

[Series 3: axial · axial · 0.38mm/px · z∈[-196,-105]mm · 11 of 153 slices shown, 14 images]
[im 11/153  brain]
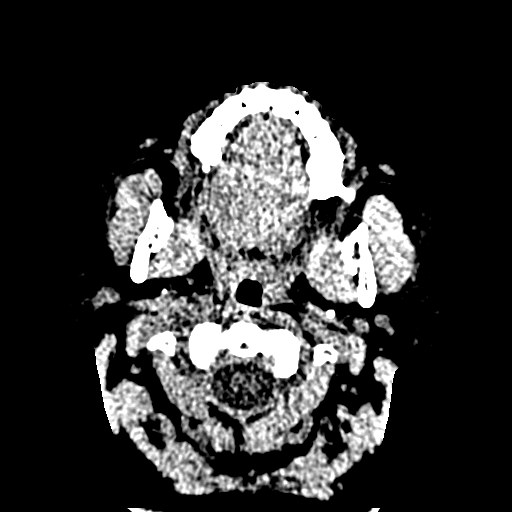
[im 11/153  bone]
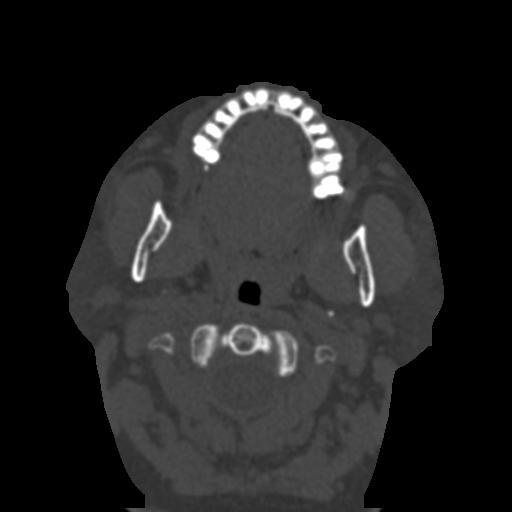
[im 21/153  bone]
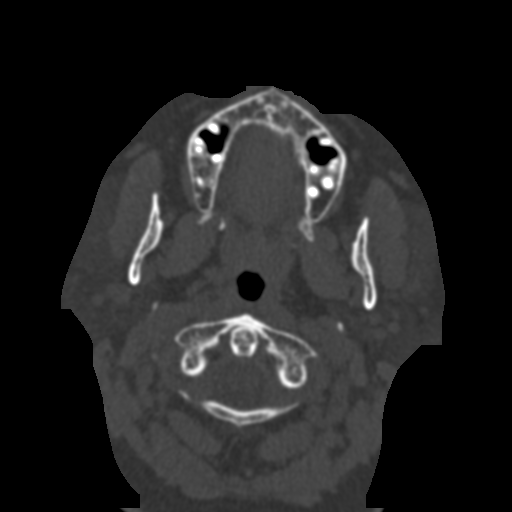
[im 37/153  bone]
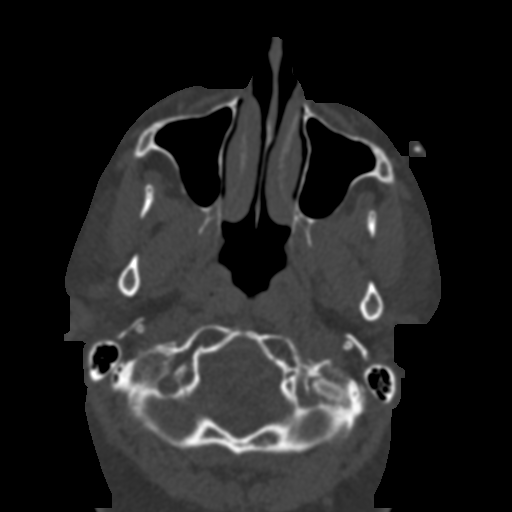
[im 48/153  bone]
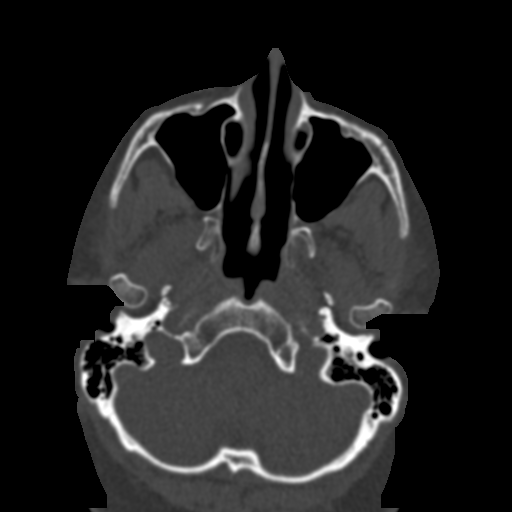
[im 63/153  brain]
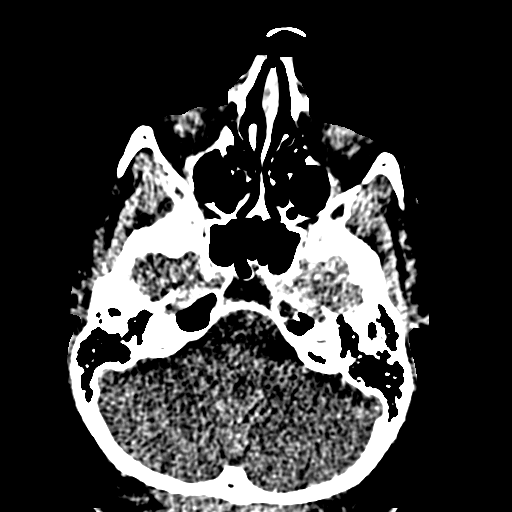
[im 63/153  bone]
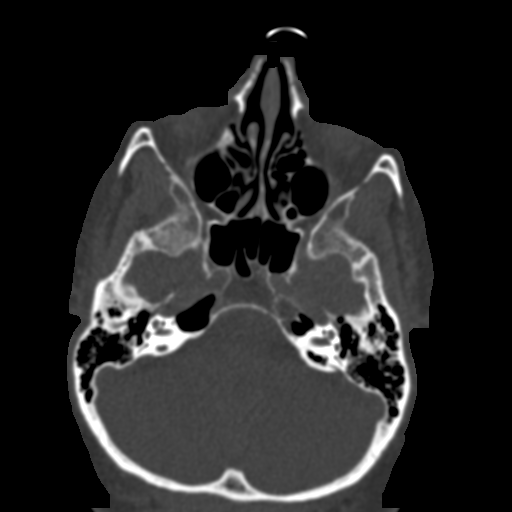
[im 79/153  bone]
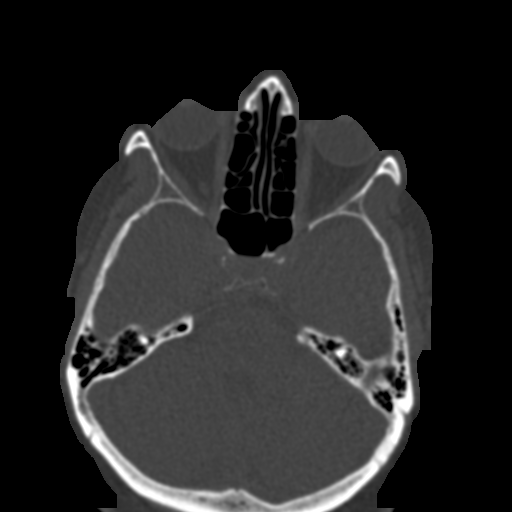
[im 90/153  bone]
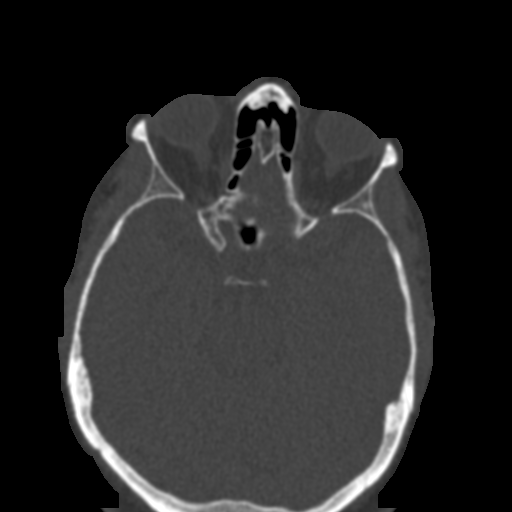
[im 105/153  bone]
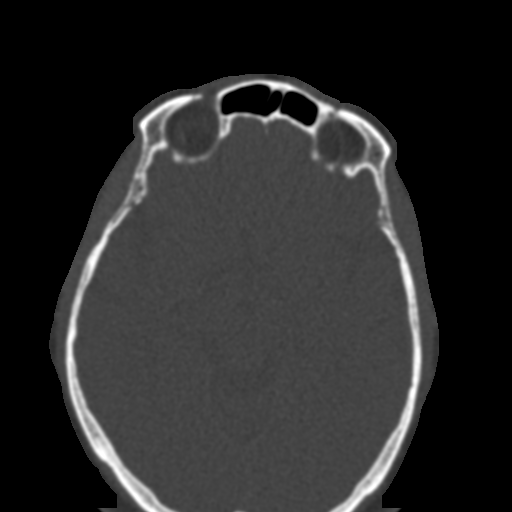
[im 116/153  brain]
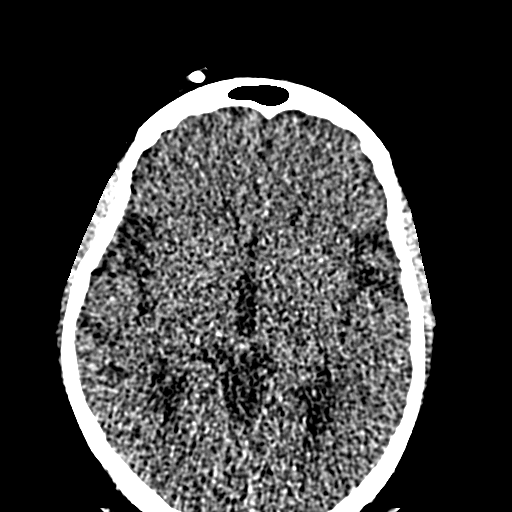
[im 116/153  bone]
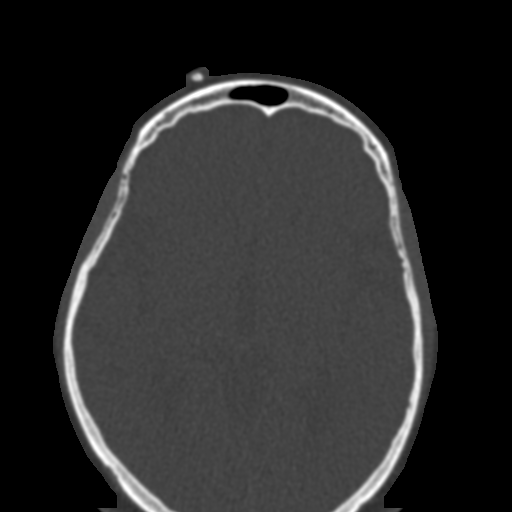
[im 132/153  bone]
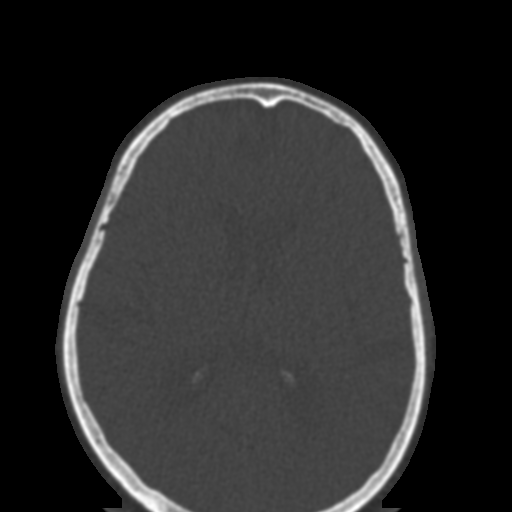
[im 142/153  bone]
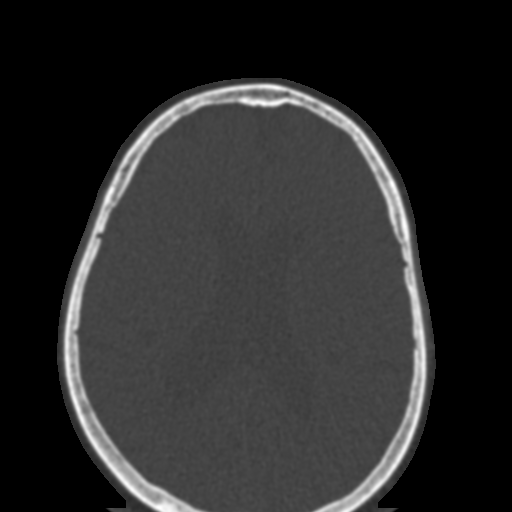

[Series 5: sag · sagittal · 0.20mm/px · 3 of 145 slices shown]
[im 49/145  bone]
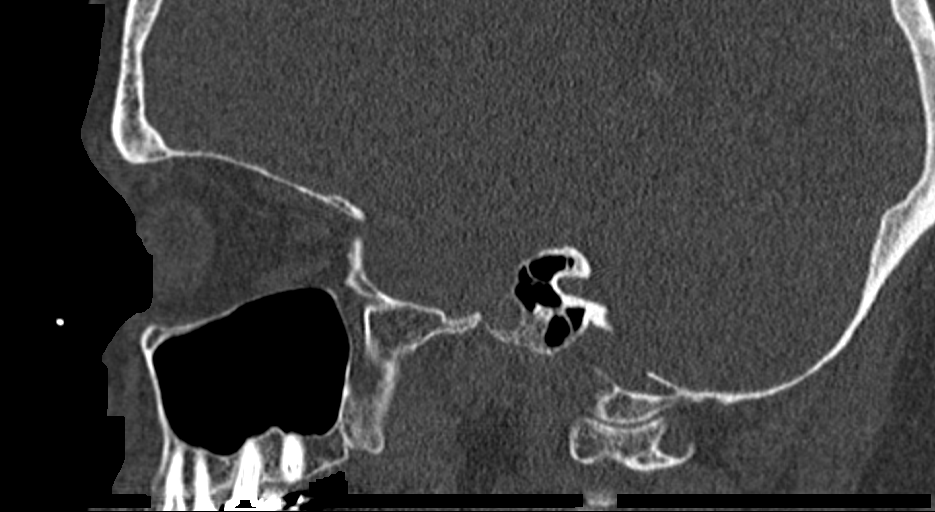
[im 73/145  bone]
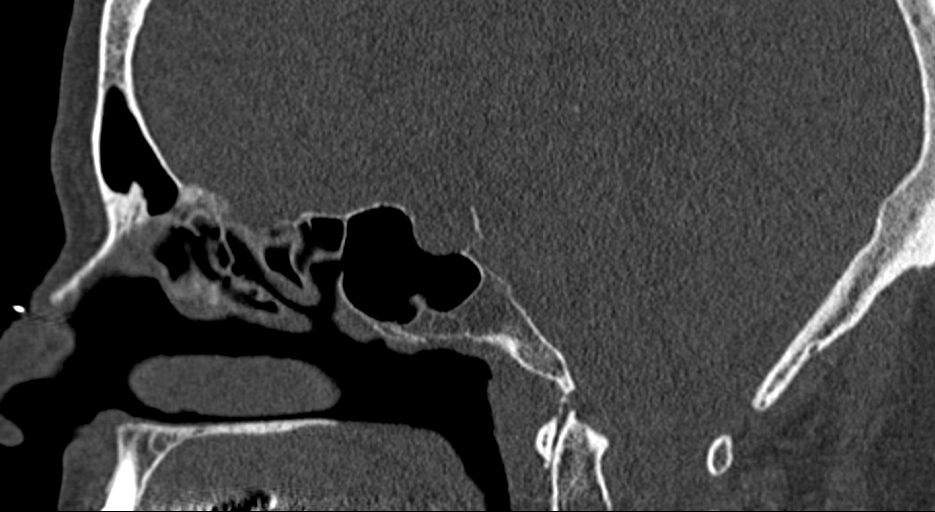
[im 97/145  bone]
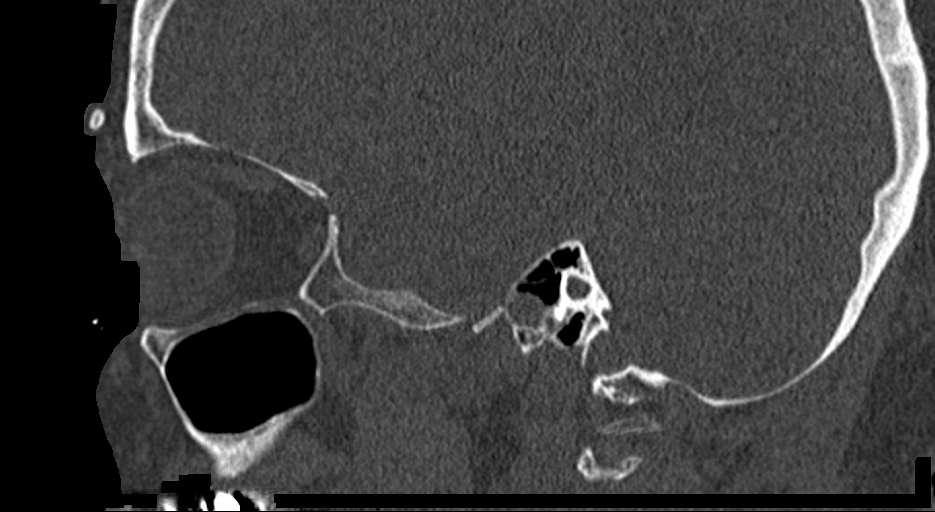

[14 of 37 positions shown; findings below may reference images not displayed]

FINDINGS: Frontal sinuses are normally developed and clear. The ethmoid, 
sphenoid and maxillary sinuses are clear. The ostiomeatal units are open. Nasal 
septum is midline. There is symmetric prominence of mucosal along the inferior 
turbinates. There is no discrete nasal fossa soft tissue mass. Floor of anterior 
cranial fossa is intact. Tympanic cavities and mastoid air cells appear clear. 
Nasopharyngeal contours are preserved.
IMPRESSION: Clear paranasal sinuses. 
Crowding of the lower nasal air passage due to symmetric inferior turbinate 
mucosal hypertrophy. No evidence for sinonasal mass. 
Otomastoid spaces are clear. 
RADIATION DOSE REDUCTION: All CT scans are performed using radiation dose 
reduction techniques, when applicable.  Technical factors are evaluated and 
adjusted to ensure appropriate moderation of exposure.  Automated dose 
management technology is applied to adjust the radiation doses to minimize 
exposure while achieving diagnostic quality images.

## 2019-12-04 MED ORDER — VALSARTAN 320 MG TAB
320 mg | ORAL_TABLET | ORAL | 1 refills | Status: DC
Start: 2019-12-04 — End: 2020-05-26

## 2019-12-04 NOTE — Telephone Encounter (Signed)
Requested Prescriptions     Pending Prescriptions Disp Refills   ??? valsartan (DIOVAN) 320 mg tablet 90 Tab 1     Sig: TAKE 1 TABLET BY MOUTH EVERY DAY

## 2020-02-20 ENCOUNTER — Encounter: Payer: MEDICARE | Attending: Interventional Cardiology

## 2020-02-23 ENCOUNTER — Ambulatory Visit: Attending: Interventional Cardiology

## 2020-02-23 ENCOUNTER — Ambulatory Visit: Admit: 2020-02-23 | Discharge: 2020-02-23 | Payer: MEDICARE | Attending: Interventional Cardiology

## 2020-02-23 DIAGNOSIS — I77819 Aortic ectasia, unspecified site: Secondary | ICD-10-CM

## 2020-02-23 MED ORDER — AMLODIPINE 5 MG TAB
5 mg | ORAL_TABLET | Freq: Every day | ORAL | 3 refills | Status: DC
Start: 2020-02-23 — End: 2020-08-09

## 2020-02-23 MED ORDER — AMLODIPINE 5 MG TAB
5 mg | ORAL_TABLET | Freq: Every day | ORAL | 3 refills | Status: DC
Start: 2020-02-23 — End: 2020-02-23

## 2020-02-23 NOTE — Progress Notes (Signed)
Progress  Notes by Thurnell Garbe, MD at 02/23/20 1145                Author: Thurnell Garbe, MD  Service: --  Author Type: Physician       Filed: 02/23/20 1248  Encounter Date: 02/23/2020  Status: Signed          Editor: Thurnell Garbe, MD (Physician)                       Perminder Lynnell Dike, MD [facility]       Mitzi Hansen, MD [facility]      Doris Cheadle, MD [facility]      Rosemary Holms, MD [facility]      Hilary Hertz, MD [facility]       Santiago Bumpers, MD [facility] Office: [phone]       [place] [phone]) [phone]   ______________________________________________________________________      Impressions/Visit Diagnoses:     Patient Active Problem List           Diagnosis  Date Noted         ?  Swelling of lower extremity  06/11/2017     ?  Essential hypertension  06/03/2016     ?  Type 2 diabetes mellitus (HCC)  06/03/2016     ?  Aortic dilatation (HCC) - very mild (3.4cm) on CTA (05/2015)  06/03/2016         ?  Morbid obesity due to excess calories (HCC)  06/03/2016                       Recommendations/Plan:   1.  EKG was reviewed with the patient and shows normal sinus rhythm and is within normal limits   2.  Blood pressure is well controlled - Rx renewed - no change in medications   3.  Diet and exercise were counseled with the patient - wt gain on 9 lbs since last visit   4.  Blood work from 02/07/20 reviewed - LDL 97 with total chol 169 and HDL of 59   5.  F/u as necessary (pt moving to [place])         I thank you for giving me opportunity to participate in care of Tiffany Harmon. I will update you with further plan of Her care at the time of follow up, mean while, should there be any questions or comments about Her management, please do not hesitate  to contact me.      Sincerely,   Thurnell Garbe, MD         ______________________________________________________________________         No change in exercise capacity - asthma for past week.  On predisone and inhalers currently.  No  CP.  No palpitations.        Past Medical History:        Diagnosis  Date         ?  Asthma       ?  Diabetes (HCC)       ?  Hypertension       ?  Thyroid disease           ?  Tibia fracture  02/2017           History reviewed. No pertinent surgical history.  Allergies        Allergen  Reactions         ?  Aspirin  Unknown (comments)             Headache          ?  Codeine  Nausea and Vomiting             Family History         Problem  Relation  Age of Onset          ?  Cancer  Father  15              Breast  & prostate          ?  Cancer  Son  11              hodgkins / lymphoma             Social History          Socioeconomic History         ?  Marital status:  MARRIED              Spouse name:  Not on file         ?  Number of children:  Not on file     ?  Years of education:  Not on file     ?  Highest education level:  Not on file       Occupational History        ?  Not on file       Tobacco Use         ?  Smoking status:  Never Smoker     ?  Smokeless tobacco:  Never Used       Substance and Sexual Activity         ?  Alcohol use:  Yes             Comment: socially         ?  Drug use:  No     ?  Sexual activity:  Not on file        Other Topics  Concern        ?  Not on file       Social History Narrative        ?  Not on file          Social Determinants of Health          Financial Resource Strain:         ?  Difficulty of Paying Living Expenses:        Food Insecurity:         ?  Worried About Charity fundraiser in the Last Year:      ?  Arboriculturist in the Last Year:        Transportation Needs:         ?  Film/video editor (Medical):      ?  Lack of Transportation (Non-Medical):        Physical Activity:         ?  Days of Exercise per Week:      ?  Minutes of Exercise per Session:        Stress:         ?  Feeling of Stress :        Social Connections:         ?  Frequency of Communication with Friends and Family:      ?  Frequency of Social Gatherings with Friends and Family:       ?  Attends Religious Services:      ?  Active Member of Clubs or Organizations:      ?  Attends Banker Meetings:      ?  Marital Status:        Intimate Partner Violence:         ?  Fear of Current or Ex-Partner:      ?  Emotionally Abused:      ?  Physically Abused:         ?  Sexually Abused:               Review of Systems    Constitutional: Negative for chills, diaphoresis, fever, malaise/fatigue and weight loss.    Respiratory: Negative for cough and shortness of breath.     Cardiovascular: Negative for chest pain, palpitations, orthopnea and leg swelling.    Gastrointestinal: Negative for abdominal pain, diarrhea, heartburn, nausea and vomiting.    Neurological: Negative for dizziness, loss of consciousness and weakness.                  Physical Exam:         Visit Vitals      BP  (!) 144/80 (BP 1 Location: Right arm, BP Patient Position: Sitting, BP Cuff Size: Adult)     Pulse  63     Temp  96.8 ??F (36 ??C) (Temporal)     Ht  5\' 3"  (1.6 m)         Wt  212 lb (96.2 kg)  Comment: PER PT        BMI  37.55 kg/m??           Physical Exam:     General: Alert, comfortable    HEENT: Normocephalic, atraumatic    Neck: No JVD, No Bruits    Chest wall: NL excursion    Lungs: Clear B/L    CV: NL S1, S2, no murmurs, gallops    Abdomen: Soft, NL BS, No distention    Extremity:No clubbing, cyanosis, trace edema    Neuro: Alert, oriented    Skin: No open sores, bruises    Pulses:  2+ dorsalis pedis        Tests - Procedures Reviewed      EKG: 02/23/20     NSR, WNL       Echocardiogram dated 06/22/2017          Labs - reviewed              No results found for: CHOL, HDL, 100065, 06/24/2017, LDLC, VLDL        Care Plan Discussion     [x]  Patient   [] Family   [] Other Physician :        Counseling Performed     Dietary Counseling and Exercise        Records Requested / Reviewed     Hospital records / Records from other physicians reviewed - summary as above. Copies in chart

## 2020-02-23 NOTE — Progress Notes (Signed)
Perminder Lynnell Dike, MD [facility]       Mitzi Hansen, MD [facility]     Doris Cheadle, MD [facility]      Norberta Keens Wyline Mood, MD [facility]     Hilary Hertz, MD [facility]       Santiago Bumpers, MD [facility] Office: [phone]       [place] [phone]  ______________________________________________________________________    Impressions/Visit Diagnoses:  Patient Active Problem List    Diagnosis Date Noted   ??? Swelling of lower extremity 06/11/2017   ??? Essential hypertension 06/03/2016   ??? Type 2 diabetes mellitus (HCC) 06/03/2016   ??? Aortic dilatation (HCC) - very mild (3.4cm) on CTA (05/2015) 06/03/2016   ??? Morbid obesity due to excess calories (HCC) 06/03/2016               Recommendations/Plan:  1. EKG was reviewed with the patient and shows normal sinus rhythm and is within normal limits  2. Blood pressure is well controlled - Rx renewed - no change in medications  3. Diet and exercise were counseled with the patient - wt gain on 9 lbs since last visit  4. Blood work from 02/07/20 reviewed - LDL 97 with total chol 169 and HDL of 59  5. F/u as necessary (pt moving to [place])      I thank you for giving me opportunity to participate in care of Blondie Riggsbee. I will update you with further plan of Her care at the time of follow up, mean while, should there be any questions or comments about Her management, please do not hesitate to contact me.    Sincerely,  Thurnell Garbe, MD      ______________________________________________________________________      No change in exercise capacity - asthma for past week.  On predisone and inhalers currently.  No CP.  No palpitations.    Past Medical History:   Diagnosis Date   ??? Asthma    ??? Diabetes (HCC)    ??? Hypertension    ??? Thyroid disease    ??? Tibia fracture 02/2017       History reviewed. No pertinent surgical history.        Allergies   Allergen Reactions   ??? Aspirin Unknown (comments)     Headache    ??? Codeine Nausea and Vomiting       Family History    Problem Relation Age of Onset   ??? Cancer Father 44        Breast  & prostate   ??? Cancer Son 55        hodgkins / lymphoma       Social History     Socioeconomic History   ??? Marital status: MARRIED     Spouse name: Not on file   ??? Number of children: Not on file   ??? Years of education: Not on file   ??? Highest education level: Not on file   Occupational History   ??? Not on file   Tobacco Use   ??? Smoking status: Never Smoker   ??? Smokeless tobacco: Never Used   Substance and Sexual Activity   ??? Alcohol use: Yes     Comment: socially   ??? Drug use: No   ??? Sexual activity: Not on file   Other Topics Concern   ??? Not on file   Social History Narrative   ??? Not on file  Social Determinants of Health     Financial Resource Strain:    ??? Difficulty of Paying Living Expenses:    Food Insecurity:    ??? Worried About Charity fundraiser in the Last Year:    ??? Arboriculturist in the Last Year:    Transportation Needs:    ??? Film/video editor (Medical):    ??? Lack of Transportation (Non-Medical):    Physical Activity:    ??? Days of Exercise per Week:    ??? Minutes of Exercise per Session:    Stress:    ??? Feeling of Stress :    Social Connections:    ??? Frequency of Communication with Friends and Family:    ??? Frequency of Social Gatherings with Friends and Family:    ??? Attends Religious Services:    ??? Marine scientist or Organizations:    ??? Attends Music therapist:    ??? Marital Status:    Intimate Production manager Violence:    ??? Fear of Current or Ex-Partner:    ??? Emotionally Abused:    ??? Physically Abused:    ??? Sexually Abused:          Review of Systems   Constitutional: Negative for chills, diaphoresis, fever, malaise/fatigue and weight loss.   Respiratory: Negative for cough and shortness of breath.    Cardiovascular: Negative for chest pain, palpitations, orthopnea and leg swelling.   Gastrointestinal: Negative for abdominal pain, diarrhea, heartburn, nausea and vomiting.   Neurological: Negative for dizziness, loss  of consciousness and weakness.           Physical Exam:      Visit Vitals  BP (!) 144/80 (BP 1 Location: Right arm, BP Patient Position: Sitting, BP Cuff Size: Adult)   Pulse 63   Temp 96.8 ??F (36 ??C) (Temporal)   Ht 5\' 3"  (1.6 m)   Wt 212 lb (96.2 kg) Comment: PER PT   BMI 37.55 kg/m??       Physical Exam:    General: Alert, comfortable   HEENT: Normocephalic, atraumatic   Neck: No JVD, No Bruits   Chest wall: NL excursion   Lungs: Clear B/L   CV: NL S1, S2, no murmurs, gallops   Abdomen: Soft, NL BS, No distention   Extremity:No clubbing, cyanosis, trace edema   Neuro: Alert, oriented   Skin: No open sores, bruises   Pulses:  2+ dorsalis pedis    Tests - Procedures Reviewed    EKG: 02/23/20    NSR, WNL     Echocardiogram dated 06/22/2017    Labs - reviewed         No results found for: CHOL, HDL, 100065, [id number], LDLC, VLDL    Care Plan Discussion   [x] Patient   [] Family   [] Other Physician :    Counseling Performed   Dietary Counseling and Exercise    Records Requested / [facility] records / Records from other physicians reviewed - summary as above. Copies in chart

## 2020-02-28 ENCOUNTER — Encounter: Attending: Interventional Cardiology

## 2020-05-26 MED ORDER — VALSARTAN 320 MG TAB
320 mg | ORAL_TABLET | ORAL | 1 refills | Status: DC
Start: 2020-05-26 — End: 2020-09-13

## 2020-06-25 IMAGING — MG MAMMOGRAPHY SCREENING BILATERAL 3D TOMOSYNTHESIS WITH CAD
8 series · 8 of 24 positions shown · non-contrast
Comparison: Comparison is made to the prior exams dating back to October 2018 
BREAST DENSITY: (Level A) The breasts are almost entirely fatty.

MAMMOGRAPHY SCREENING BILATERAL 3D TOMOSYNTHESIS WITH CAD, 06/25/2020 [DATE]: 
CLINICAL INDICATION: Screening
TECHNIQUE: Digital bilateral mammograms and 3-D Tomosynthesis were obtained. 
These were interpreted both primarily and with the aid of computer-aided 
detection system.

[L MLO]
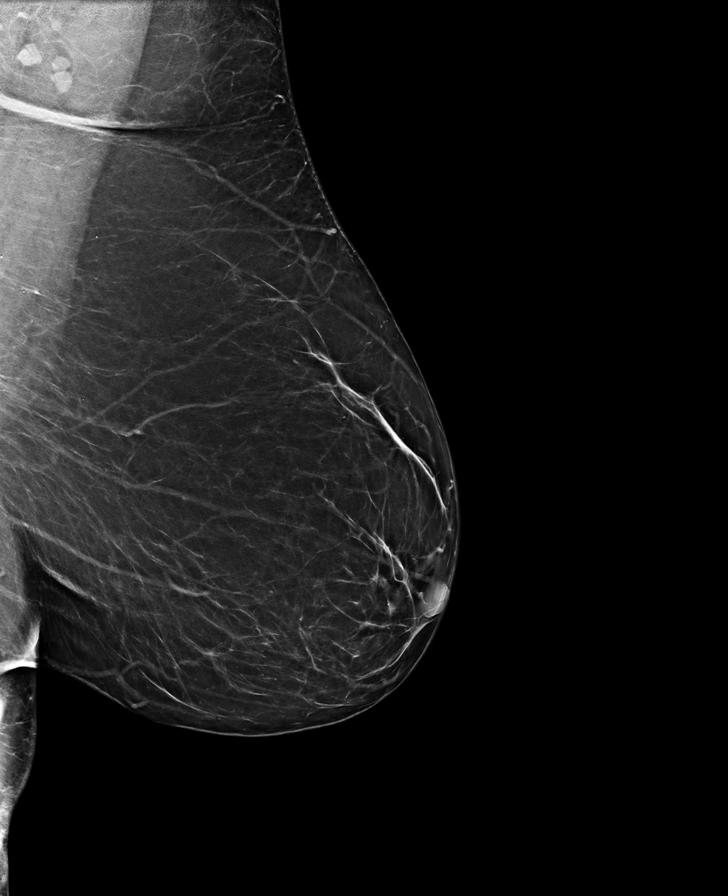

[L CC]
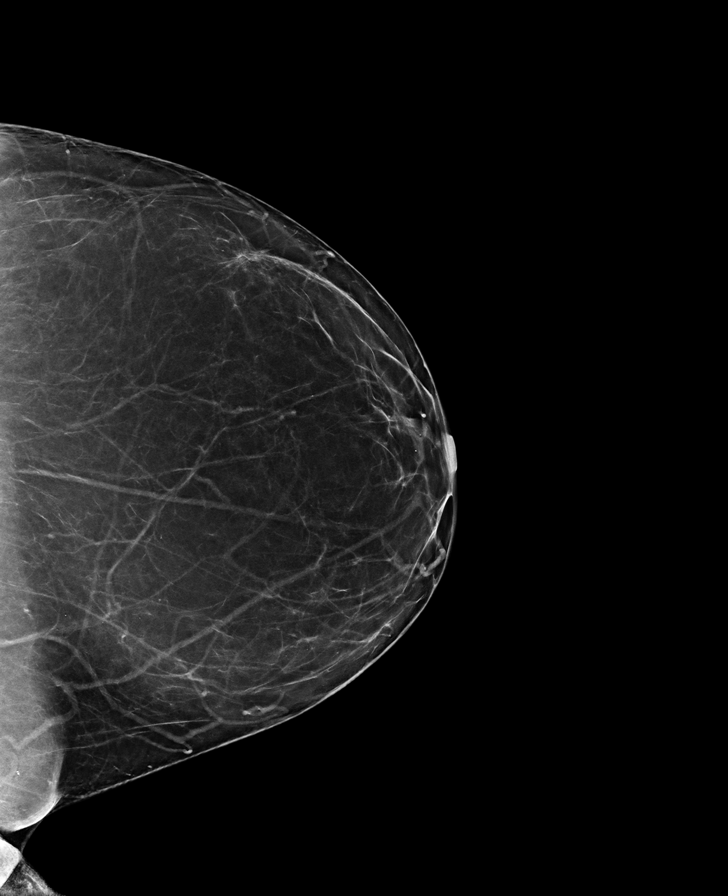

[R MLO]
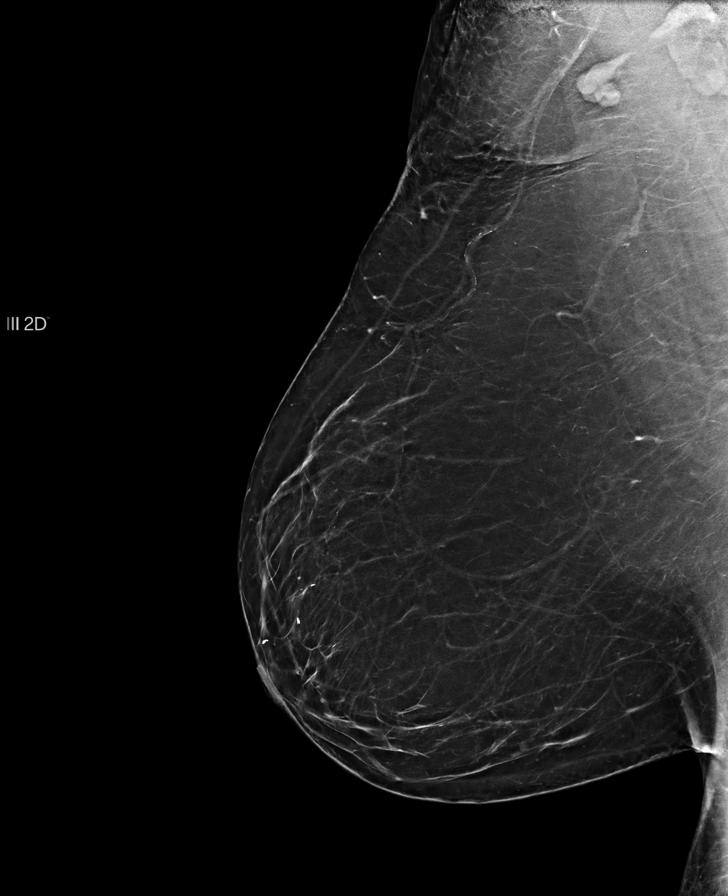

[R CC]
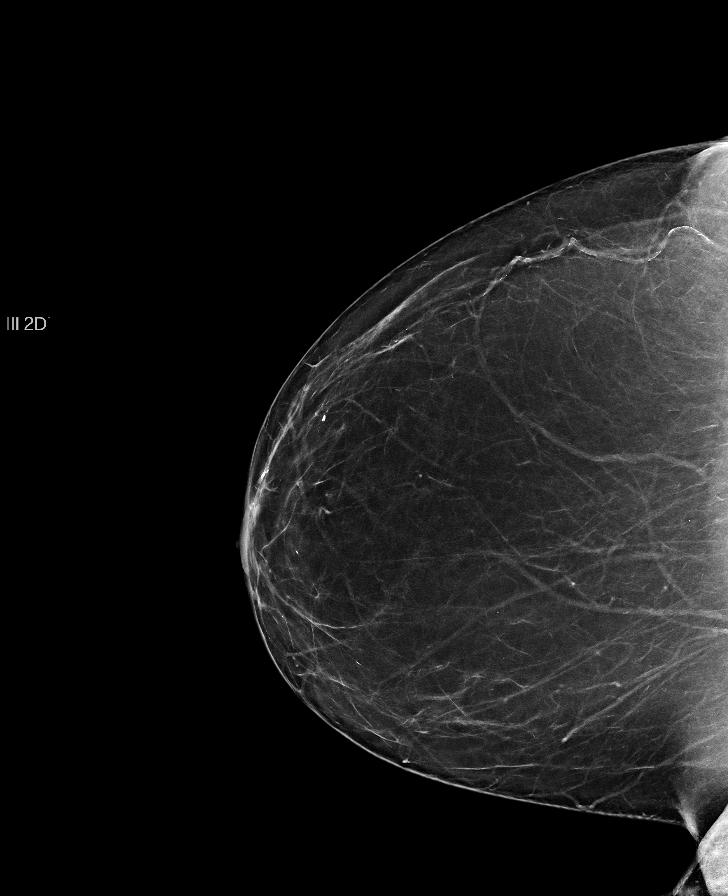

[R MLO tomo · tomo slice 47/92.0]
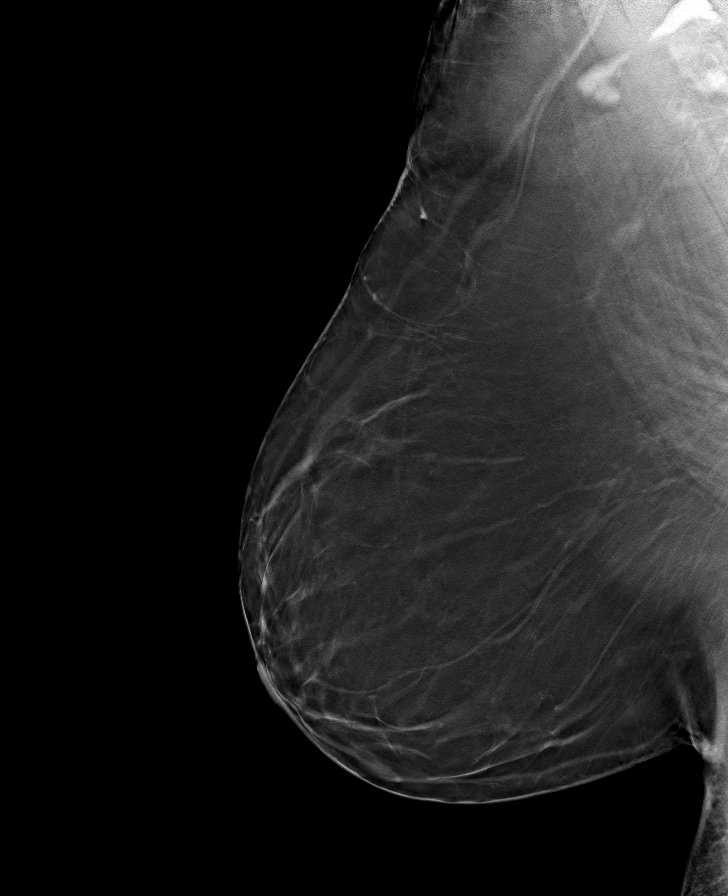

[L CC tomo · tomo slice 35/69.0]
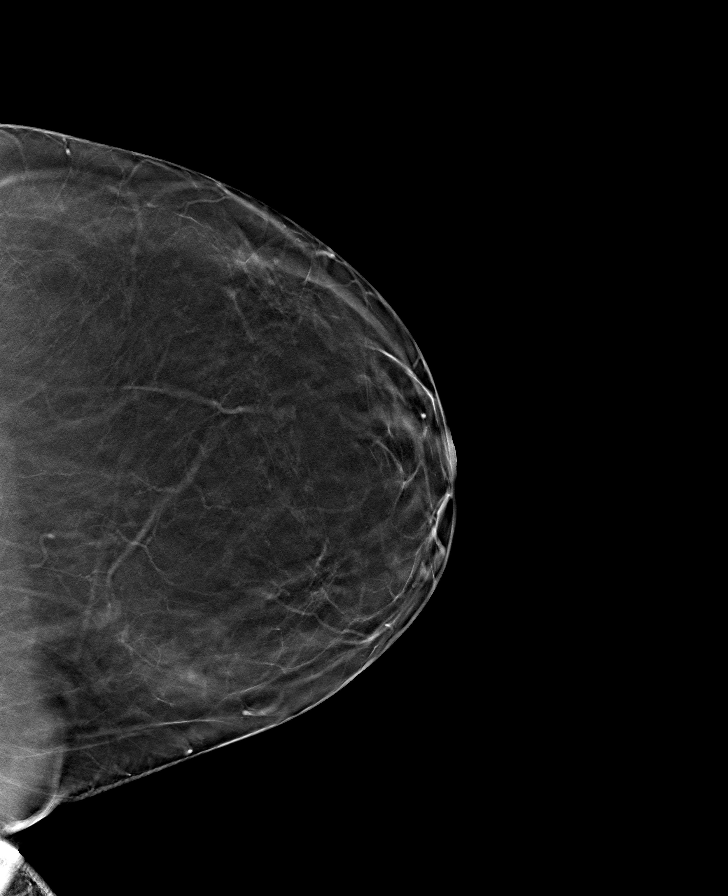

[L MLO tomo · tomo slice 43/84.0]
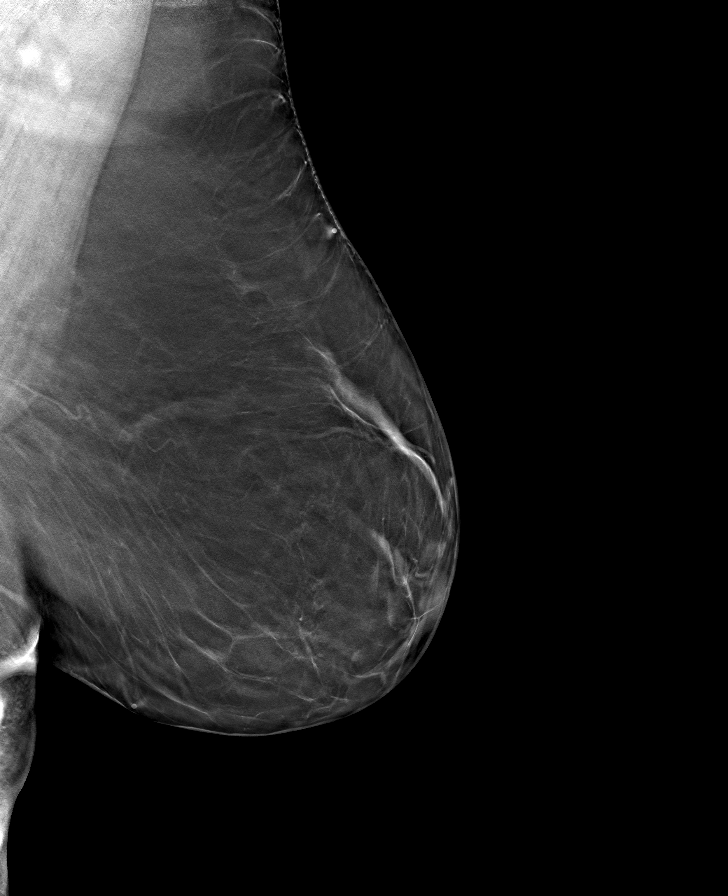

[R CC tomo · tomo slice 39/77.0]
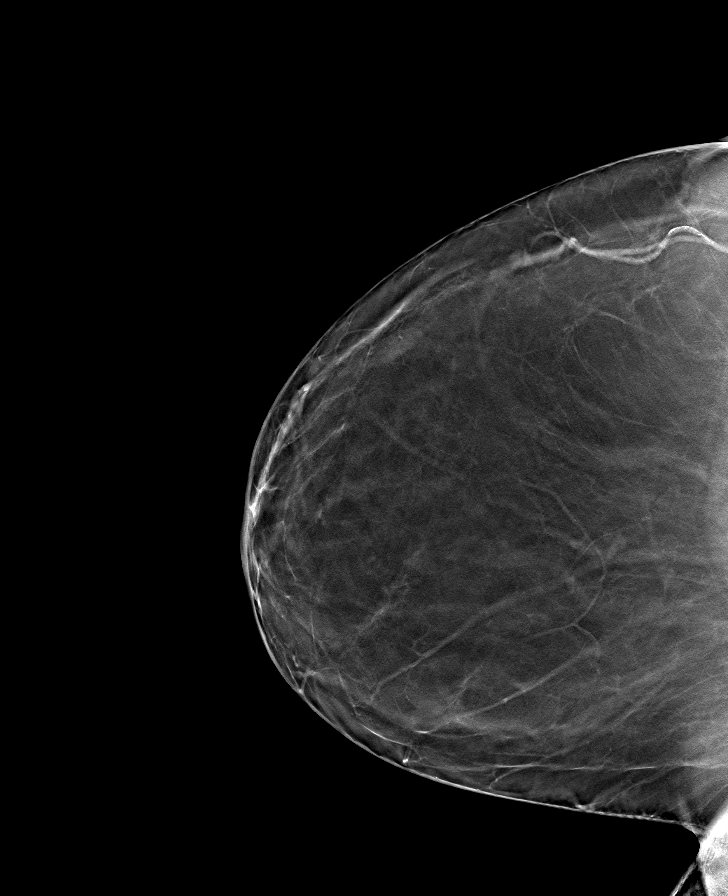

[8 of 24 positions shown; findings below may reference images not displayed]

FINDINGS: There is no evidence of mass or malignant appearing microcalcifications 
seen on today's examination. No skin thickening is identified. There is thought 
to be an overall stable mammographic appearance.
IMPRESSION: ( BI-RADS 2) Benign findings. Routine mammographic follow-up is recommended.

## 2020-08-09 MED ORDER — AMLODIPINE 5 MG TAB
5 mg | ORAL_TABLET | ORAL | 1 refills | Status: DC
Start: 2020-08-09 — End: 2020-11-29

## 2020-09-13 MED ORDER — VALSARTAN 320 MG TAB
320 mg | ORAL_TABLET | ORAL | 1 refills | Status: DC
Start: 2020-09-13 — End: 2021-04-27

## 2020-09-13 MED ORDER — CHLORTHALIDONE 25 MG TAB
25 mg | ORAL_TABLET | ORAL | 3 refills | Status: AC
Start: 2020-09-13 — End: ?

## 2020-11-29 MED ORDER — AMLODIPINE 5 MG TAB
5 mg | ORAL_TABLET | ORAL | 1 refills | Status: DC
Start: 2020-11-29 — End: 2021-06-19

## 2021-04-27 MED ORDER — VALSARTAN 320 MG TAB
320 mg | ORAL_TABLET | ORAL | 1 refills | Status: DC
Start: 2021-04-27 — End: 2021-10-31

## 2021-06-19 MED ORDER — AMLODIPINE 5 MG TAB
5 mg | ORAL_TABLET | ORAL | 1 refills | Status: DC
Start: 2021-06-19 — End: 2022-02-24

## 2021-07-21 IMAGING — CT CT SINUS WITHOUT CONTRAST
3 series · 12 of 47 positions shown, 14 images · non-contrast
Comparison: CT sinus November 27, 2019.

________________________________________________________________________________________________ 
CT SINUS WITHOUT CONTRAST, 07/21/2021 [DATE]: 
CLINICAL INDICATION: 74-year-old female with chronic maxillary sinusitis. Recent 
tooth extraction April 2021. 4 rounds of antibiotics with relief. 
A search for DICOM formatted images was conducted for prior CT imaging studies 
completed at a non-affiliated media free facility.
TECHNIQUE: The paranasal sinuses were scanned without contrast on a high 
resolution CT scanner using dose reduction techniques.  Routine MPR 
reconstructions were performed.

[Series 3: sinus st 0.6 h30s · axial · 0.41mm/px · z∈[-144,-63]mm · 6 of 171 slices shown, 8 images]
[im 18/171  brain]
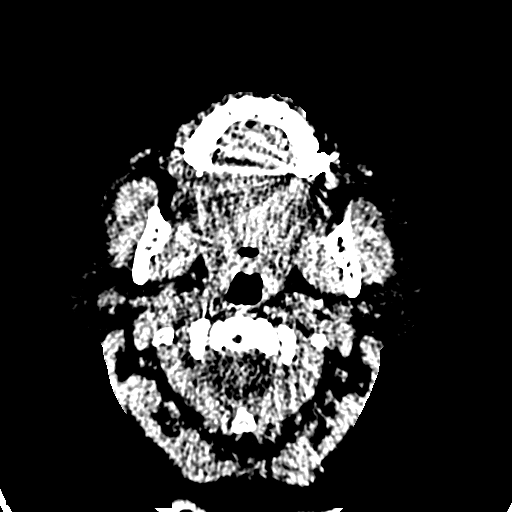
[im 18/171  bone]
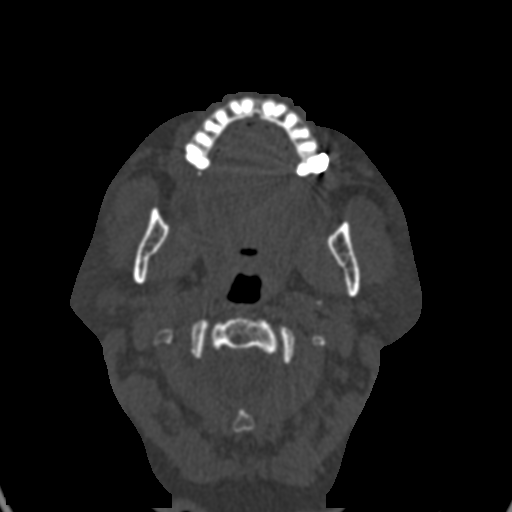
[im 47/171  bone]
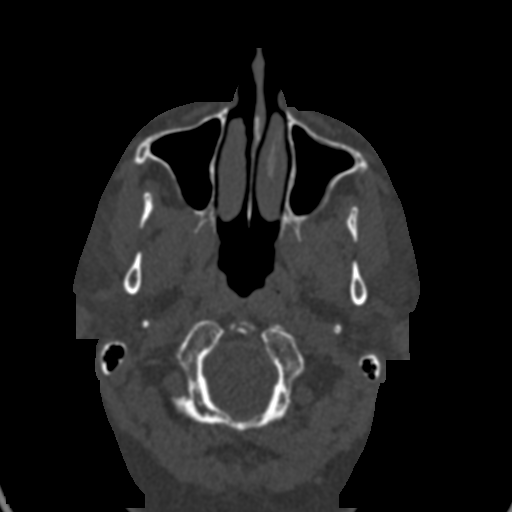
[im 71/171  bone]
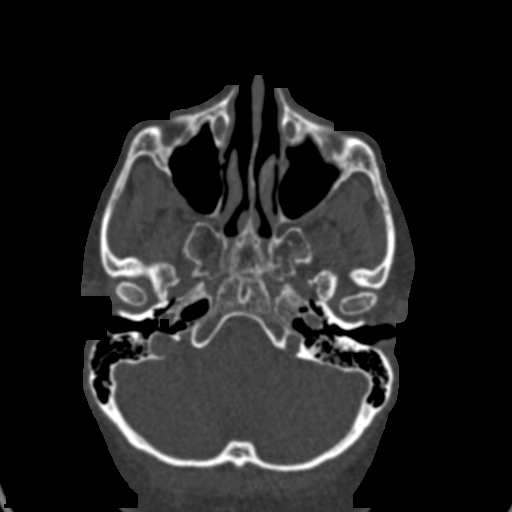
[im 100/171  bone]
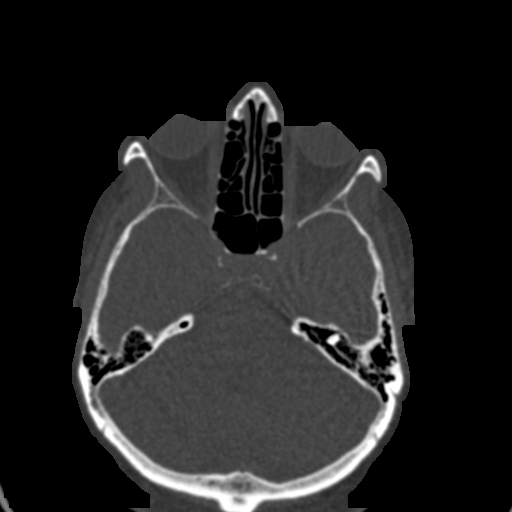
[im 129/171  brain]
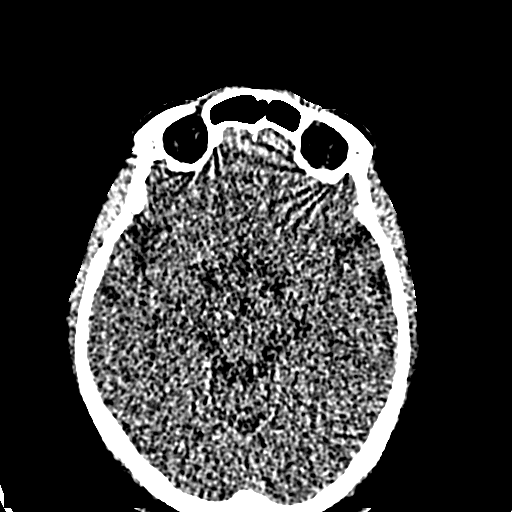
[im 129/171  bone]
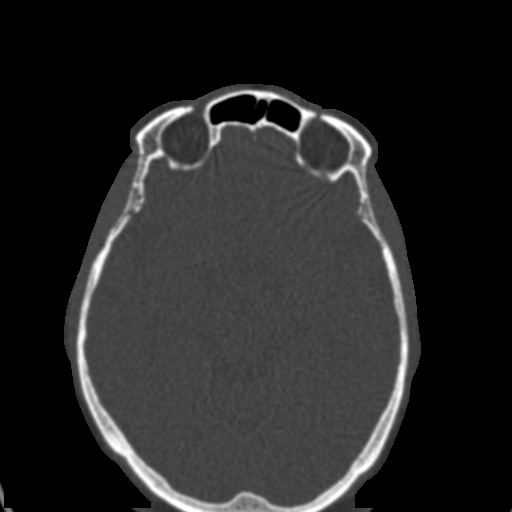
[im 153/171  bone]
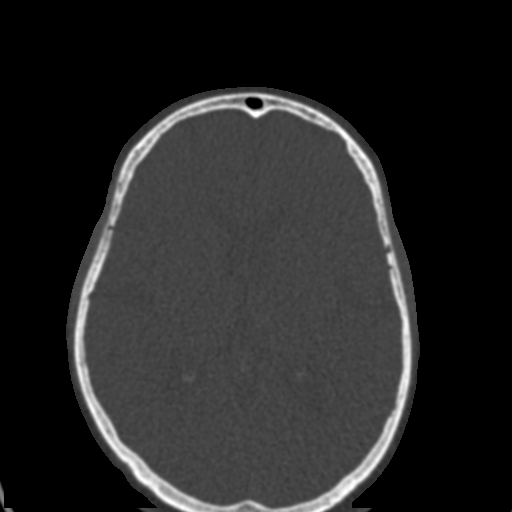

[Series 4: coronal · coronal · 0.22mm/px · 3 of 293 slices shown]
[im 98/293  bone]
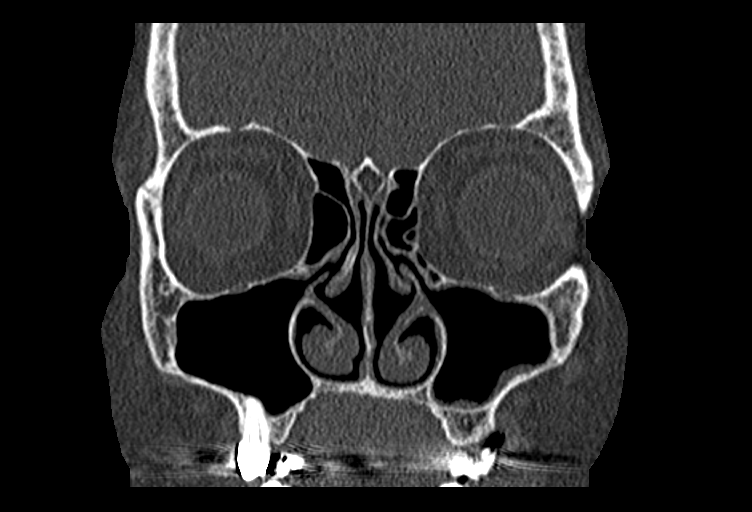
[im 130/293  bone]
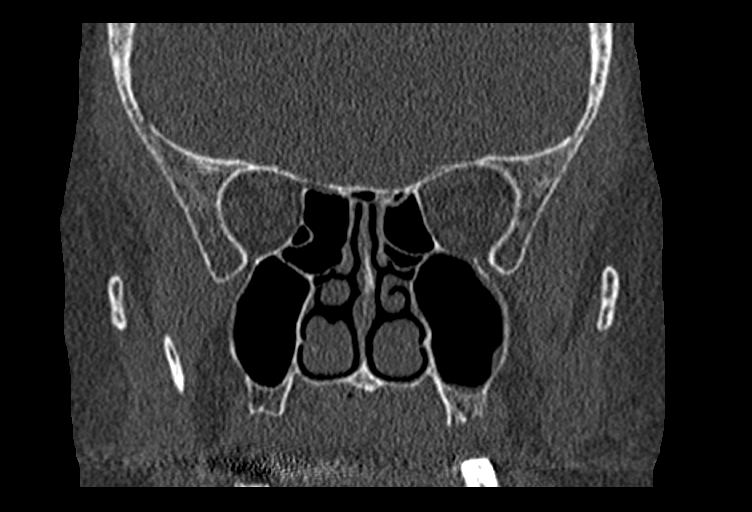
[im 163/293  bone]
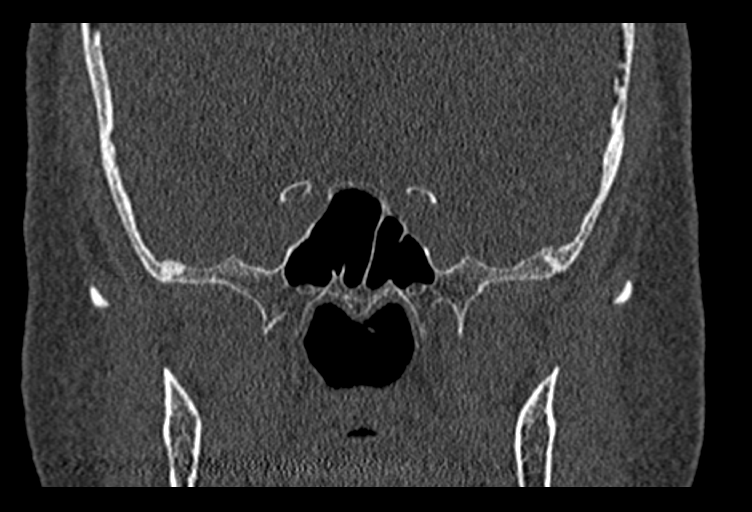

[Series 5: sag · sagittal · 0.23mm/px · 3 of 295 slices shown]
[im 99/295  bone]
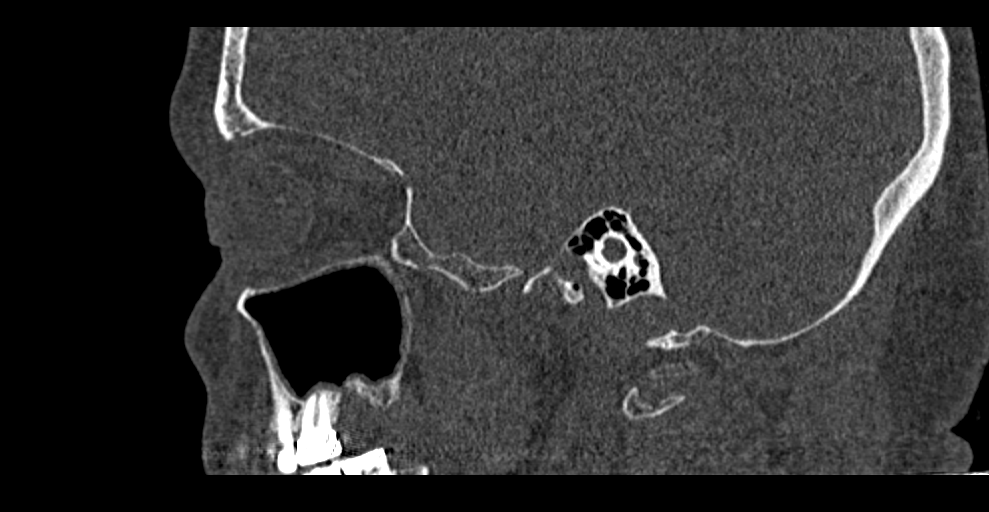
[im 148/295  bone]
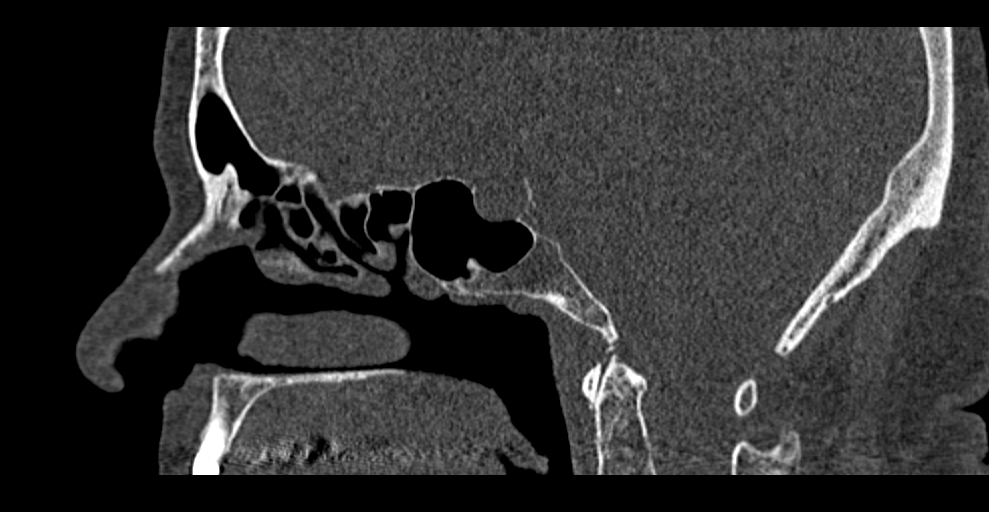
[im 197/295  bone]
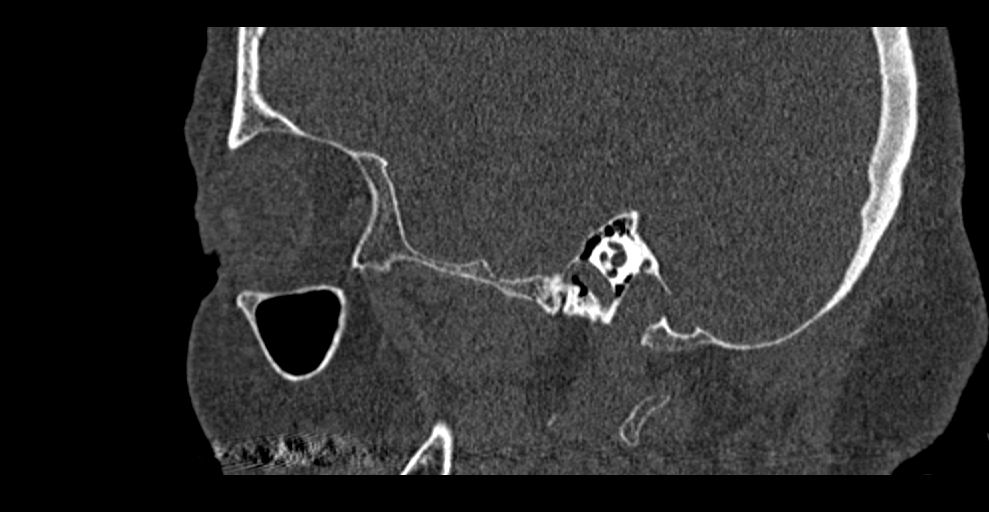

[12 of 47 positions shown; findings below may reference images not displayed]

FINDINGS: FRONTAL SINUSES: Clear. 

ETHMOID AIR CELLS: Clear.  Keros 2.  

SPHENOID SINUS: Clear. Anterior clinoid processes are not pneumatized. 

MAXILLARY SINUSES: Minimal 2 mm membrane thickening inferior floor left 
maxillary sinus. There is a 1 mm breach in the inferior floor of the left 
maxillary sinus, see sagittal image 99 of series 5 at the site of extraction of 
the left maxillary second molar. This is also identified on coronal image 116 of 
series 4. 

NASAL CAVITY: Intact nasal septum. Mild hypertrophy of the inferior turbinates 
is again noted bilaterally. 

NASOPHARYNX: Adenoids within normal limits for age. No mass in the distribution 
of the visualized eustachian tube present. Parapharyngeal space clear.  There is 
a linear type filling defect within the nasopharynx which may reflect some 
mucous. 

OSTIOMEATAL UNITS: Bilaterally patent. 

REMAINDER OF SKULL INCLUDING ORBITS AND SURROUNDING SOFT TISSUES: No mass or 
inflammation. Stable dense 4 mm calcification subcutaneous tissues right frontal 
region.
IMPRESSION: Minimal left maxillary membrane thickening could be odontogenic in nature and 
has progressed since the previous study. 1 mm breach in the inferior floor left 
maxillary sinus at the site of extraction of the left maxillary second molar.  
RADIATION DOSE REDUCTION: All CT scans are performed using radiation dose 
reduction techniques, when applicable.  Technical factors are evaluated and 
adjusted to ensure appropriate moderation of exposure.  Automated dose 
management technology is applied to adjust the radiation doses to minimize 
exposure while achieving diagnostic quality images.

## 2021-10-31 MED ORDER — VALSARTAN 320 MG TAB
320 mg | ORAL_TABLET | ORAL | 0 refills | Status: AC
Start: 2021-10-31 — End: ?

## 2022-02-24 MED ORDER — AMLODIPINE 5 MG TAB
5 mg | ORAL_TABLET | ORAL | 1 refills | Status: AC
Start: 2022-02-24 — End: ?

## 2022-12-09 IMAGING — MG MAMMOGRAPHY SCREENING BILATERAL 3[PERSON_NAME]
8 series · 8 of 24 positions shown · non-contrast
Comparison: Comparison was made to prior examinations.

________________________________________________________________________________________________ 
MAMMOGRAPHY SCREENING BILATERAL [id number] SCH[place]OR, 12/09/2022 [DATE]: 
CLINICAL INDICATION: Encounter for screening mammogram.
TECHNIQUE: Digital bilateral mammograms and 3-D Tomosynthesis were obtained. 
These were interpreted both primarily and with the aid of computer-aided 
detection system.  
BREAST DENSITY: (Level A) The breasts are almost entirely fatty.

[L MLO]
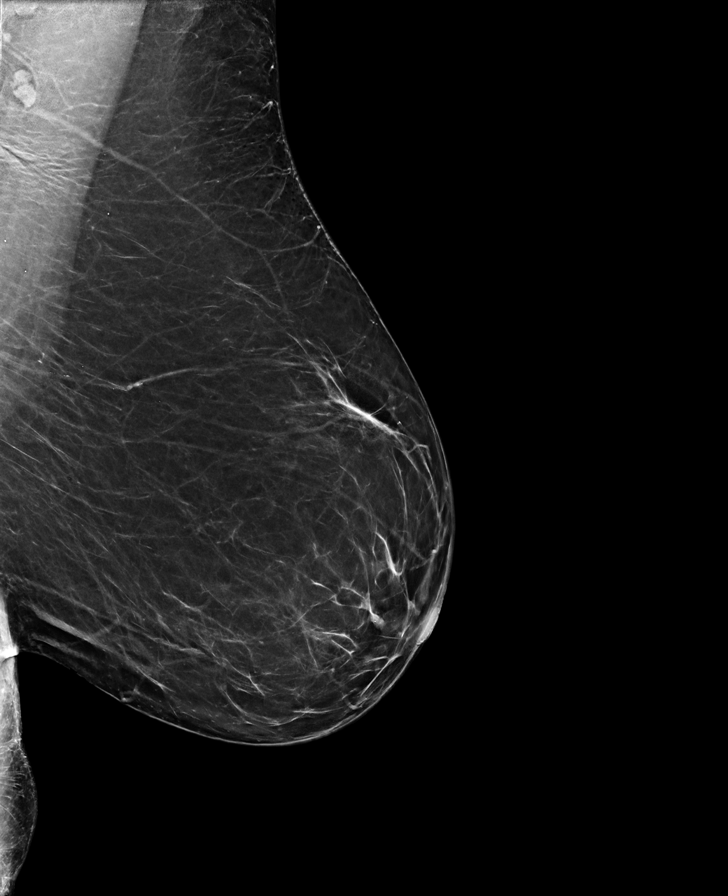

[L CC]
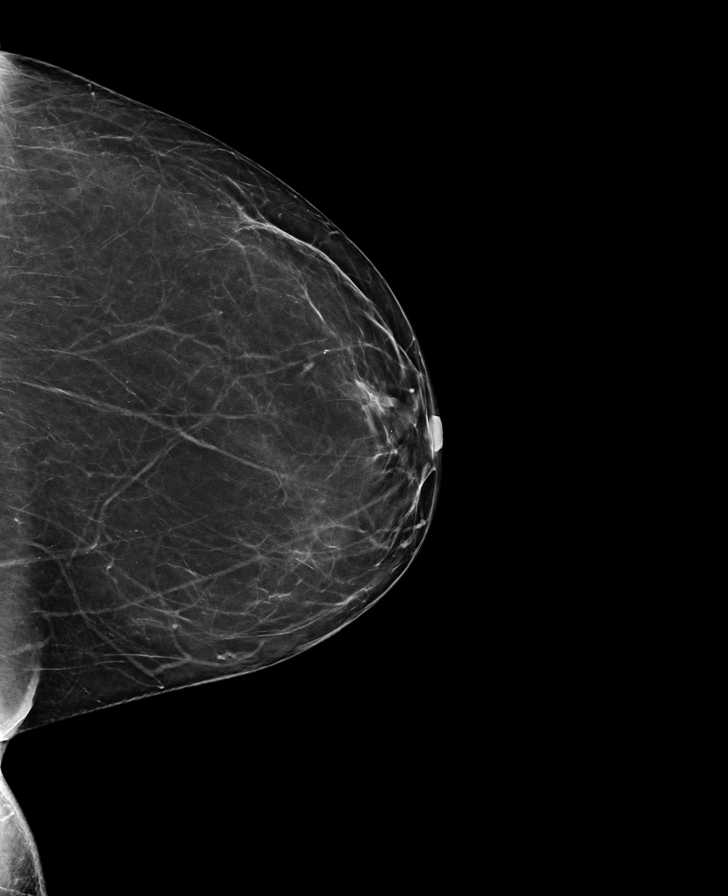

[R CC]
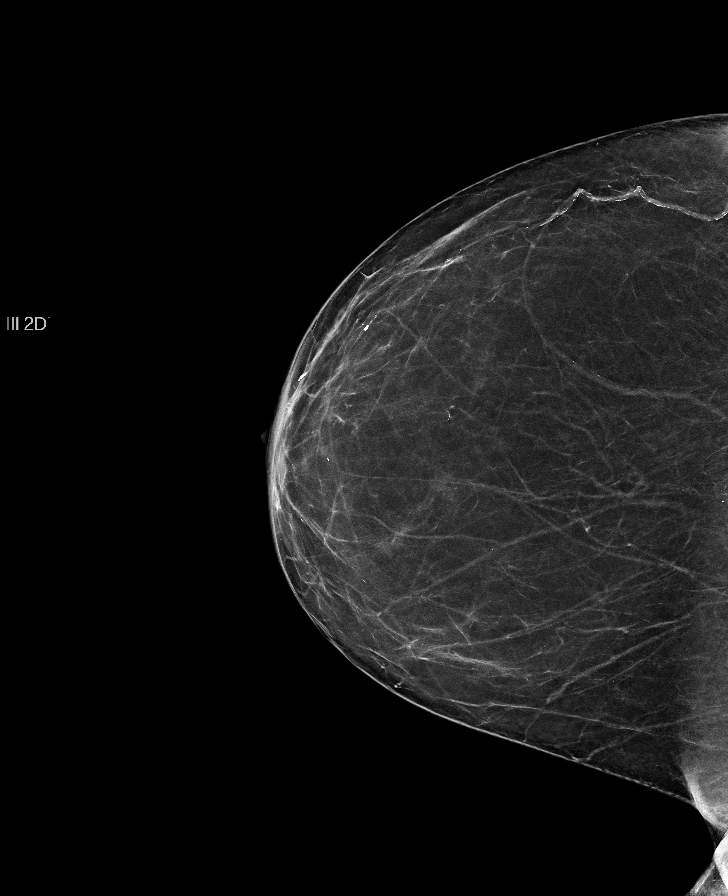

[R MLO]
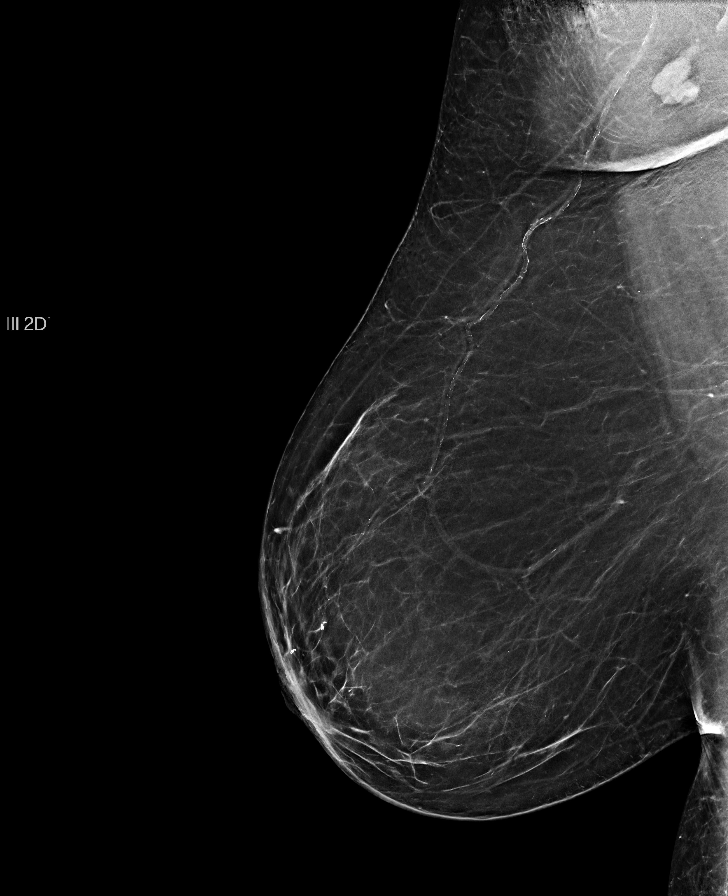

[L MLO tomo · tomo slice 37/74.0]
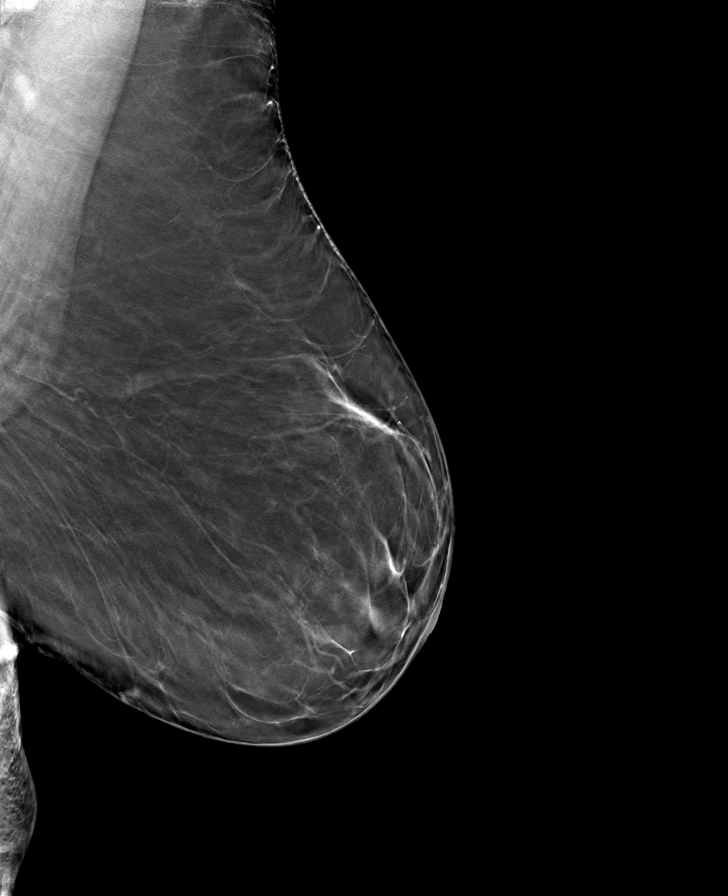

[R MLO tomo · tomo slice 41/80.0]
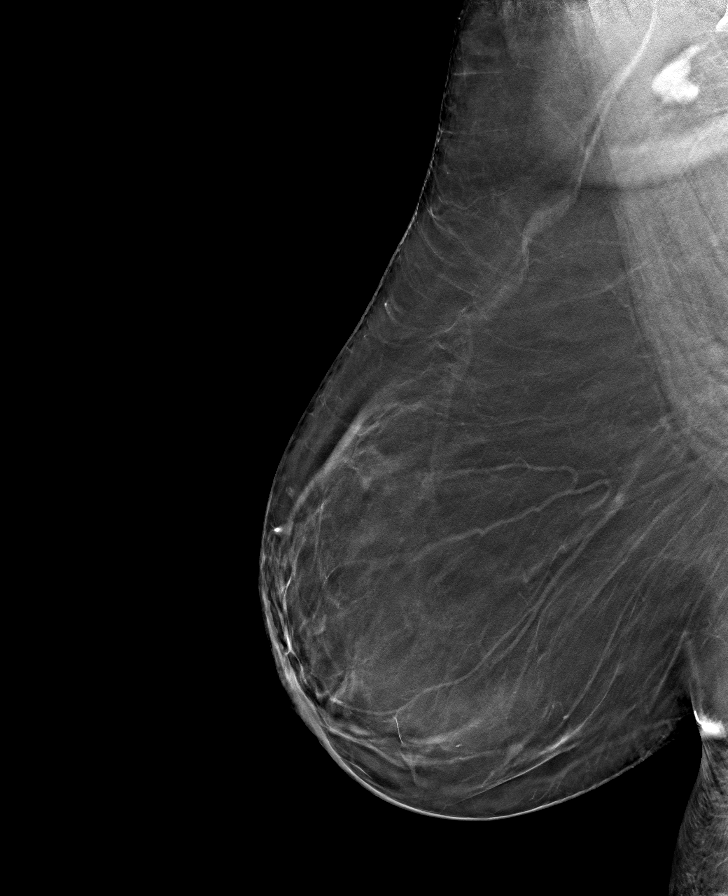

[R CC tomo · tomo slice 33/65.0]
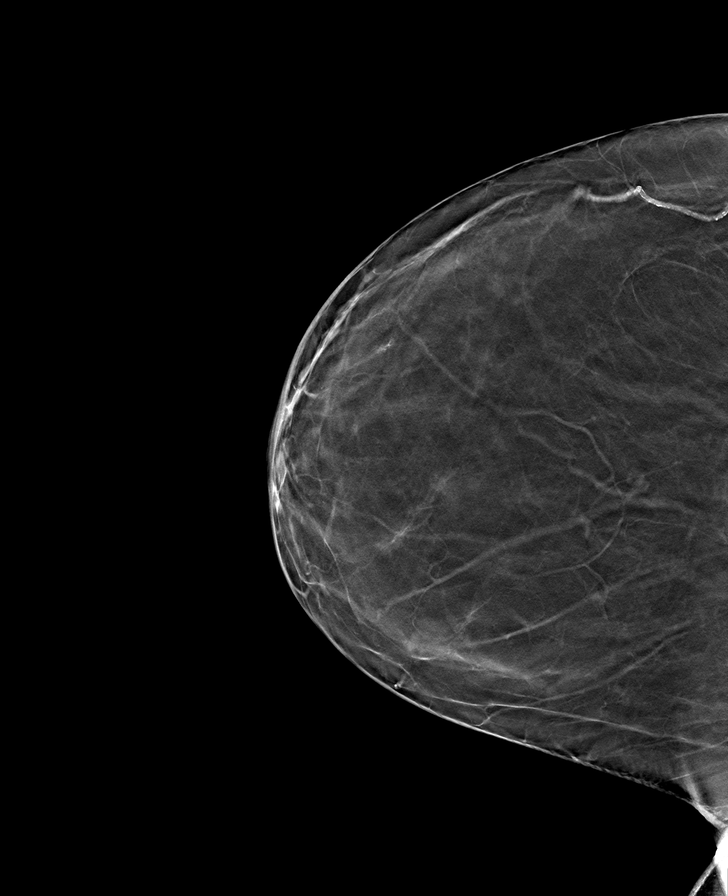

[L CC tomo · tomo slice 33/66.0]
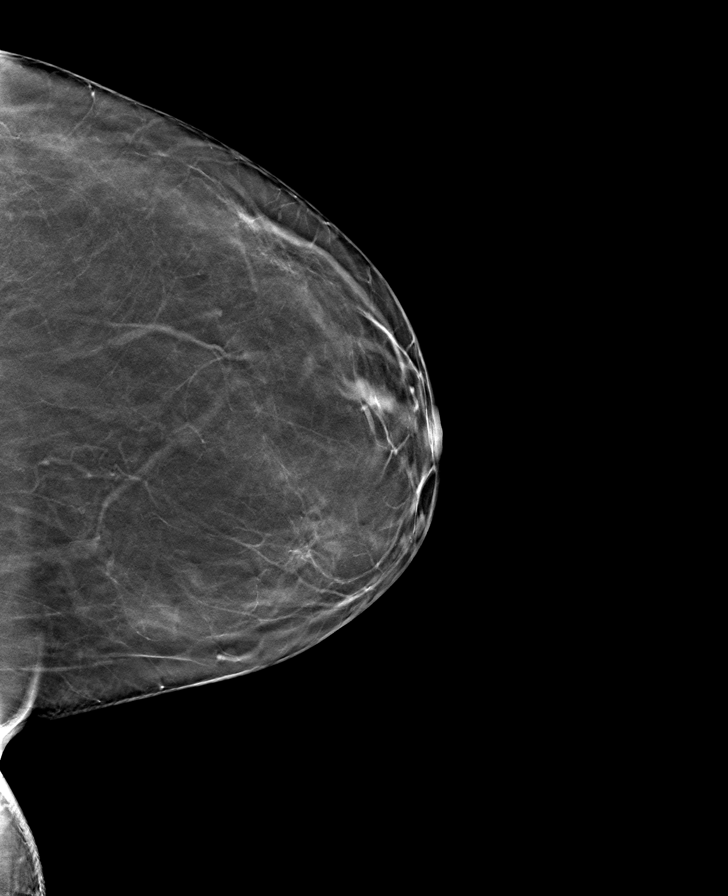

[8 of 24 positions shown; findings below may reference images not displayed]

FINDINGS: No suspicious mass, calcifications, or area of architectural 
distortion in either breast.
IMPRESSION: (BI-RADS 2) Benign findings. Routine mammographic follow-up is recommended.

## 2023-09-19 IMAGING — MR MRI RIGHT KNEE WITHOUT CONTRAST
4 of 5 series · 19 of 40 positions shown · IV contrast (gadolinium)
Comparison: None.

________________________________________________________________________________________________ 
MRI RIGHT KNEE WITHOUT CONTRAST, 09/19/2023 [DATE]: 
CLINICAL INDICATION: Unilateral primary osteoarthritis, right knee.
TECHNIQUE: Multiplanar, multiecho position MR images of the right knee were 
performed without intravenous gadolinium enhancement.

[Series 301: PD fat-sat · axial · right · 3.0mm · 0.31mm/px · z∈[-61,+61]mm · 8 of 36 slices shown (1 of 3)]
[im 1/36]
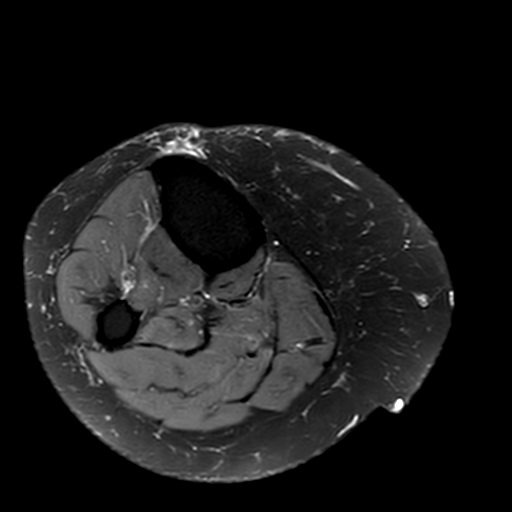
[im 4/36]
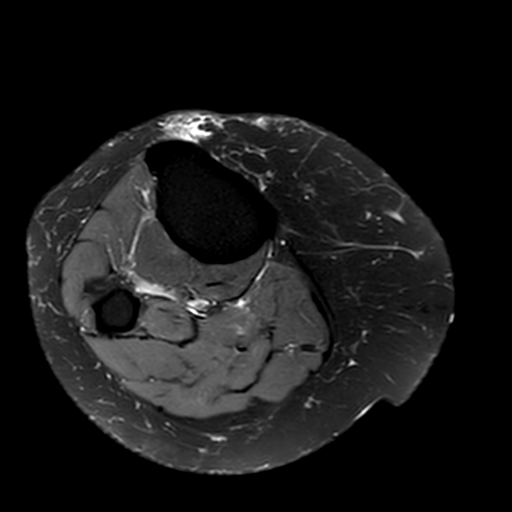
[im 12/36]
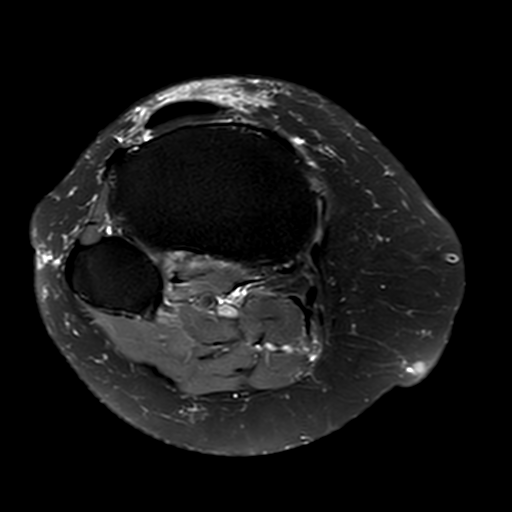
[im 16/36]
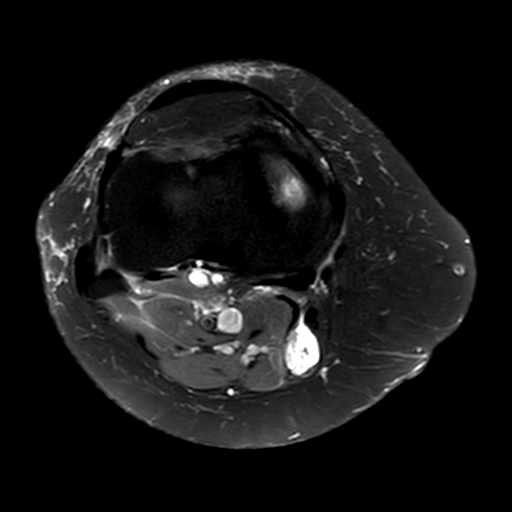
[im 20/36]
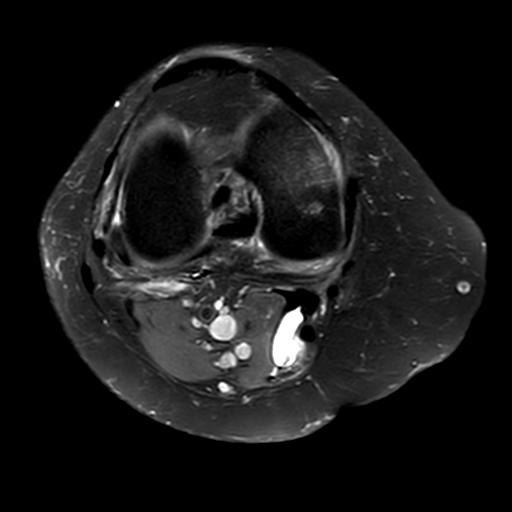
[im 24/36]
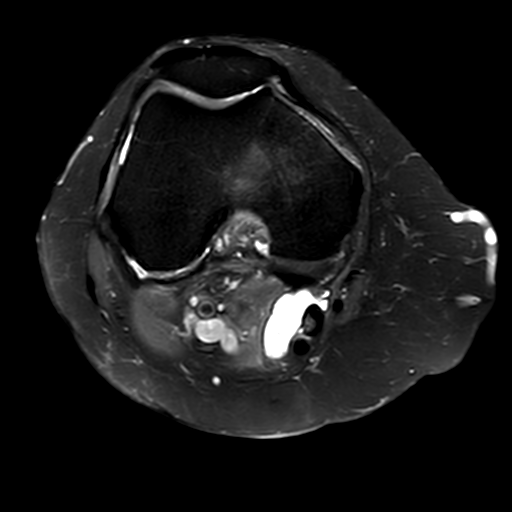
[im 32/36]
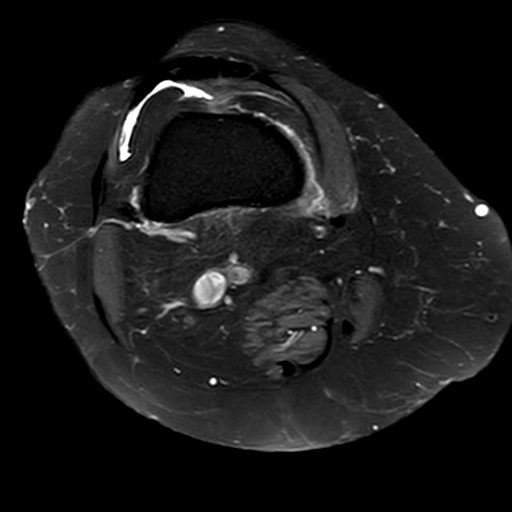
[im 36/36]
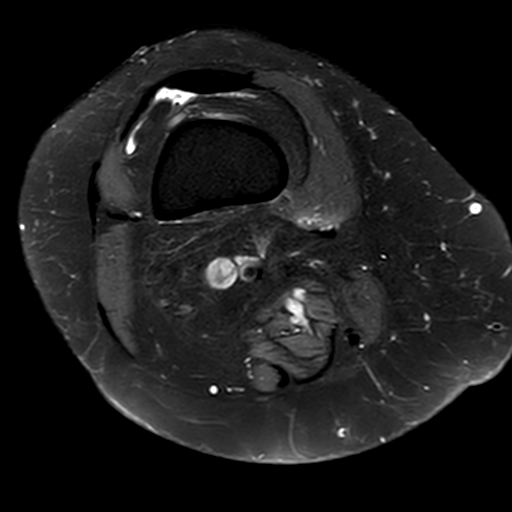

[Series 401: PD fat-sat · sagittal · right · 3.0mm · 0.28mm/px · 5 of 32 slices shown (2 of 3)]
[im 1/32]
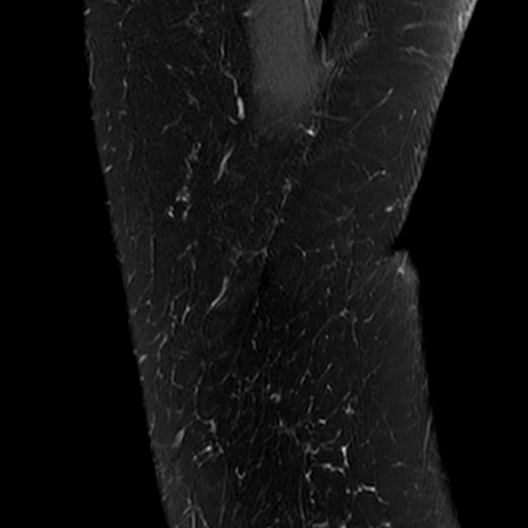
[im 4/32]
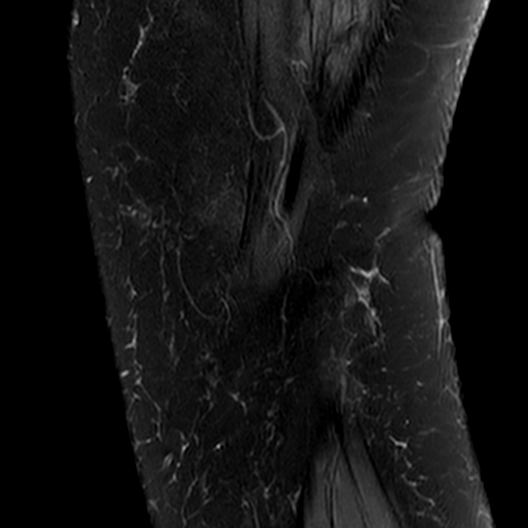
[im 8/32]
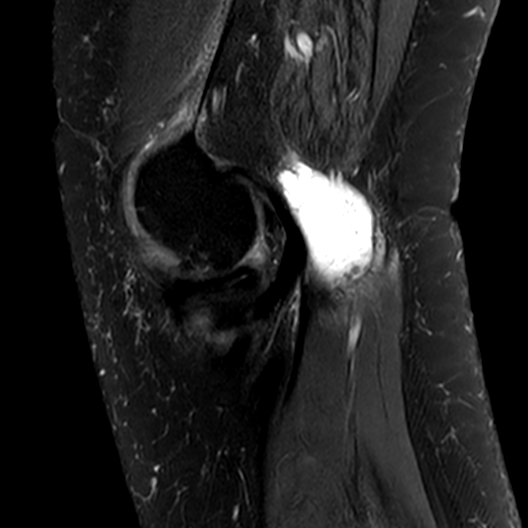
[im 16/32]
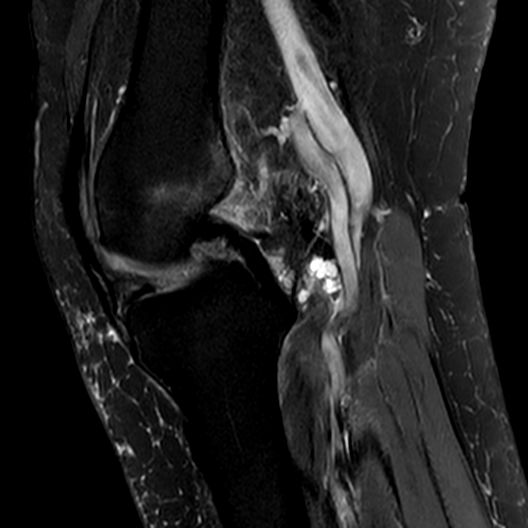
[im 28/32]
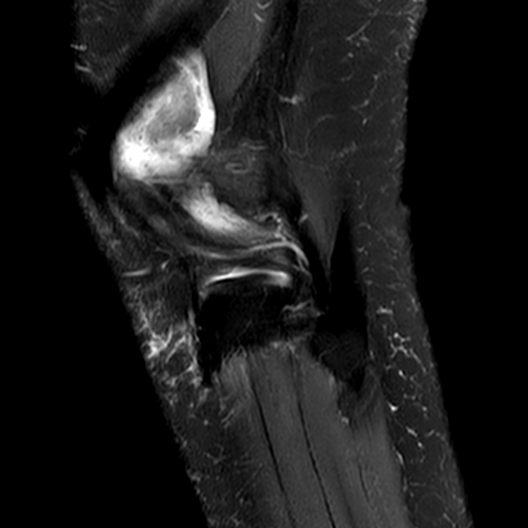

[Series 501: T1 · coronal · right · 3.0mm · 0.31mm/px · 3 of 35 slices shown]
[im 4/35]
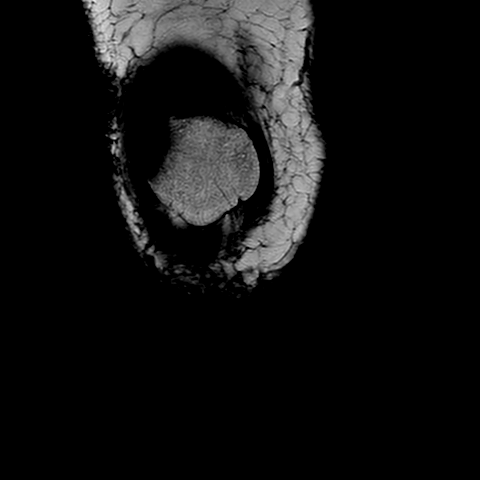
[im 19/35]
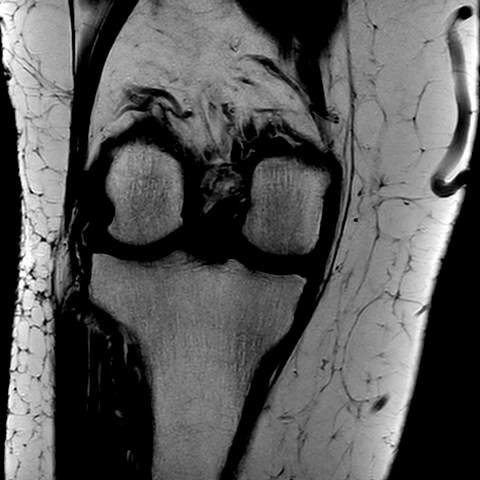
[im 31/35]
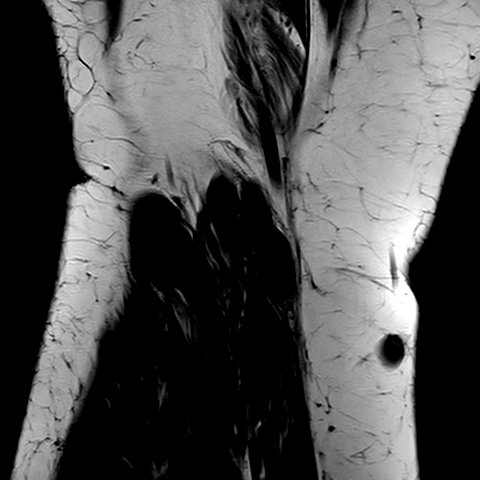

[Series 601: PD fat-sat · coronal · right · 3.0mm · 0.31mm/px · 3 of 35 slices shown (3 of 3)]
[im 4/35]
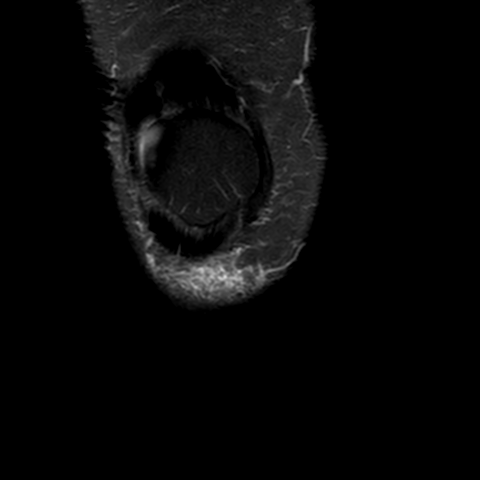
[im 19/35]
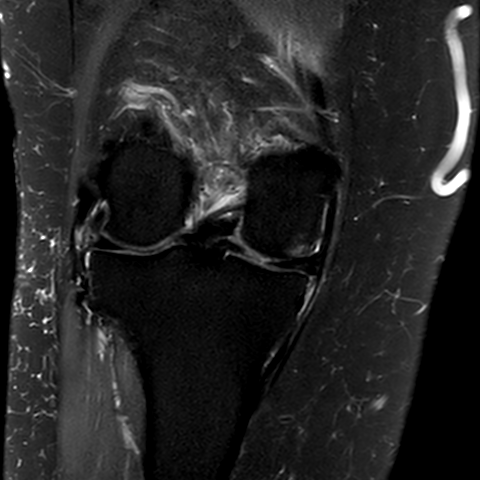
[im 31/35]
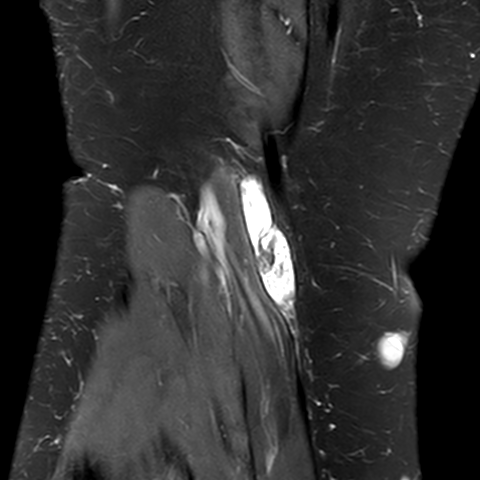

[19 of 40 positions shown; findings below may reference images not displayed]

FINDINGS: MEDIAL COMPARTMENT: Complex tear of the body and posterior horn of the medial 
meniscus with full-thickness posterior horn radial component. 0.3 cm medial 
meniscal extrusion. Up to grade III chondromalacia. Small osteophytes. 
LATERAL COMPARTMENT: The lateral meniscus is intact without tear or extrusion. 
Up to grade III chondromalacia and subchondral bone marrow edema of the medial 
aspect of the lateral tibial plateau and posterior lateral femoral condyle. 
Small osteophytes. 
PATELLOFEMORAL COMPARTMENT: The patella is centrally located. Up to grade III 
chondromalacia. 
PROXIMAL TIBIOFIBULAR JOINT: Preserved. 
LIGAMENTS: The anterior cruciate ligament is intact. The posterior cruciate 
ligament is intact. The medial collateral ligament and lateral collateral 
ligament are preserved.  
EXTENSOR MECHANISM: The quadriceps and patellar tendon are preserved. The medial 
and lateral retinacula are intact. 
POSTEROMEDIAL CORNER: The semimembranosus and pes anserine tendons are 
preserved. The posterior oblique ligament and posterior medial joint capsule are 
intact. 
POSTEROLATERAL CORNER: The popliteal tendon and popliteofibular ligament are 
intact. The biceps femoris is negative. 
BONES AND SOFT TISSUES: Subacute, nondisplaced medial femoral condylar 
subchondral insufficiency fracture measures 0.7 x 1.0 cm in cross-section with 
medial femoral condylar/intercondylar bone marrow edema. Mild lateral tibial 
plateau bone marrow edema. Small joint effusion. 2.1 x 1.2 x 5.5 cm popliteal 
cyst with synovitis and septation. The musculature is symmetric without mass, 
signal abnormality or atrophy. Superficial varicosities. Anterior subcutaneous 
soft tissue swelling.
IMPRESSION: 1.  0.7 x 1.0 cm subacute, nondisplaced medial femoral condylar subchondral 
insufficiency fracture is likely secondary to meniscal tear. 
2.  Complex tear of the body/posterior horn of the medial meniscus with 
full-thickness posterior horn radial component and medial meniscal extrusion.   
3.  Moderate medial/patellofemoral and mild lateral compartment chondromalacia. 
4.  Anterior subcutaneous soft tissue swelling.
